# Patient Record
Sex: Male | Born: 1960
Health system: Southern US, Community
[De-identification: ages and names within clinical notes are randomized; demographics above are authoritative.]

## PROBLEM LIST (undated history)

## (undated) DIAGNOSIS — E119 Type 2 diabetes mellitus without complications: Secondary | ICD-10-CM

## (undated) DIAGNOSIS — I10 Essential (primary) hypertension: Secondary | ICD-10-CM

---

## 2003-04-03 ENCOUNTER — Encounter: Admission: RE | Admit: 2003-04-03 | Discharge: 2003-04-03 | Payer: Self-pay | Admitting: Internal Medicine

## 2005-04-16 IMAGING — CR DG CHEST 2V
2 series · 2 of 2 positions shown · non-contrast
Comparison: none

CLINICAL DATA: Iritis, cough, back pain.  Evaluate for possible sarcoid. 
 LUMBAR SPINE: 
 Five views of the lumbar spine were obtained.  The lumbar vertebrae are in normal alignment with normal intervertebral disc spaces.  Minimal anterior spurring is noted on the anterior superior aspect of L5.  The SI joints appear normal.

[view not recorded (1 of 2)]
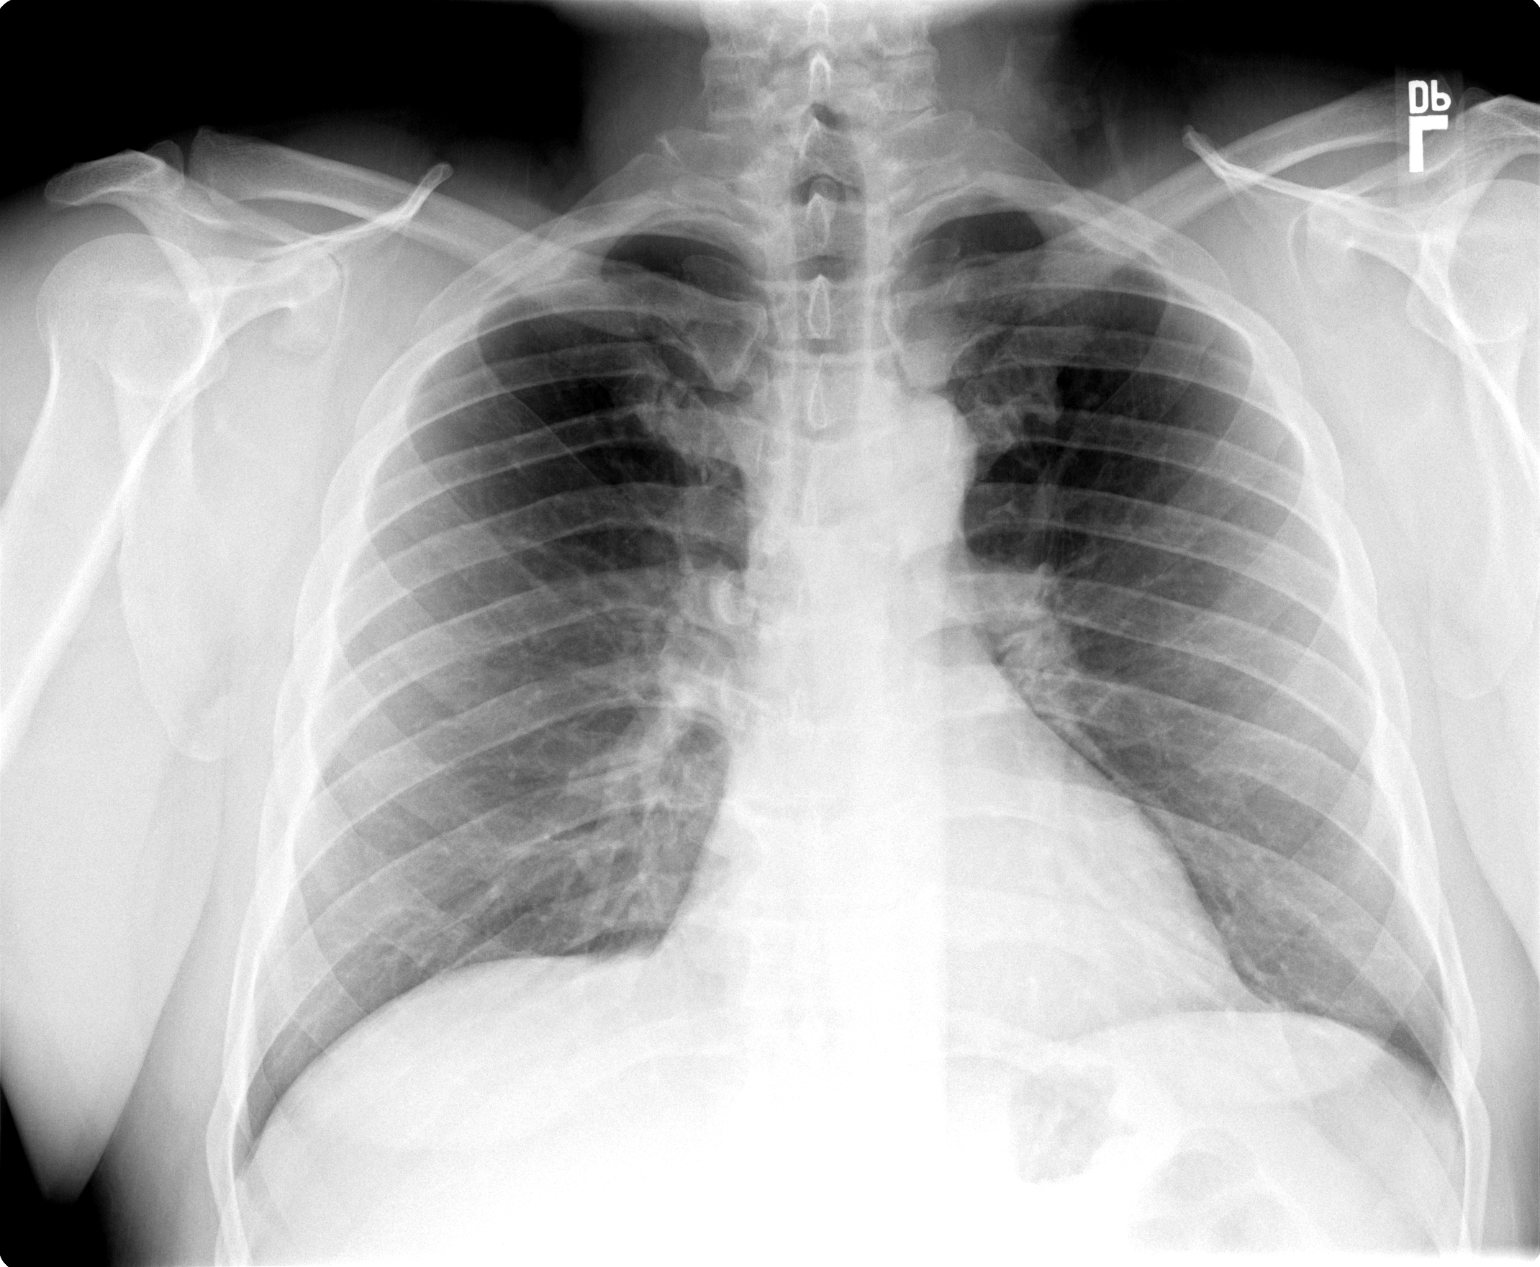

[view not recorded (2 of 2)]
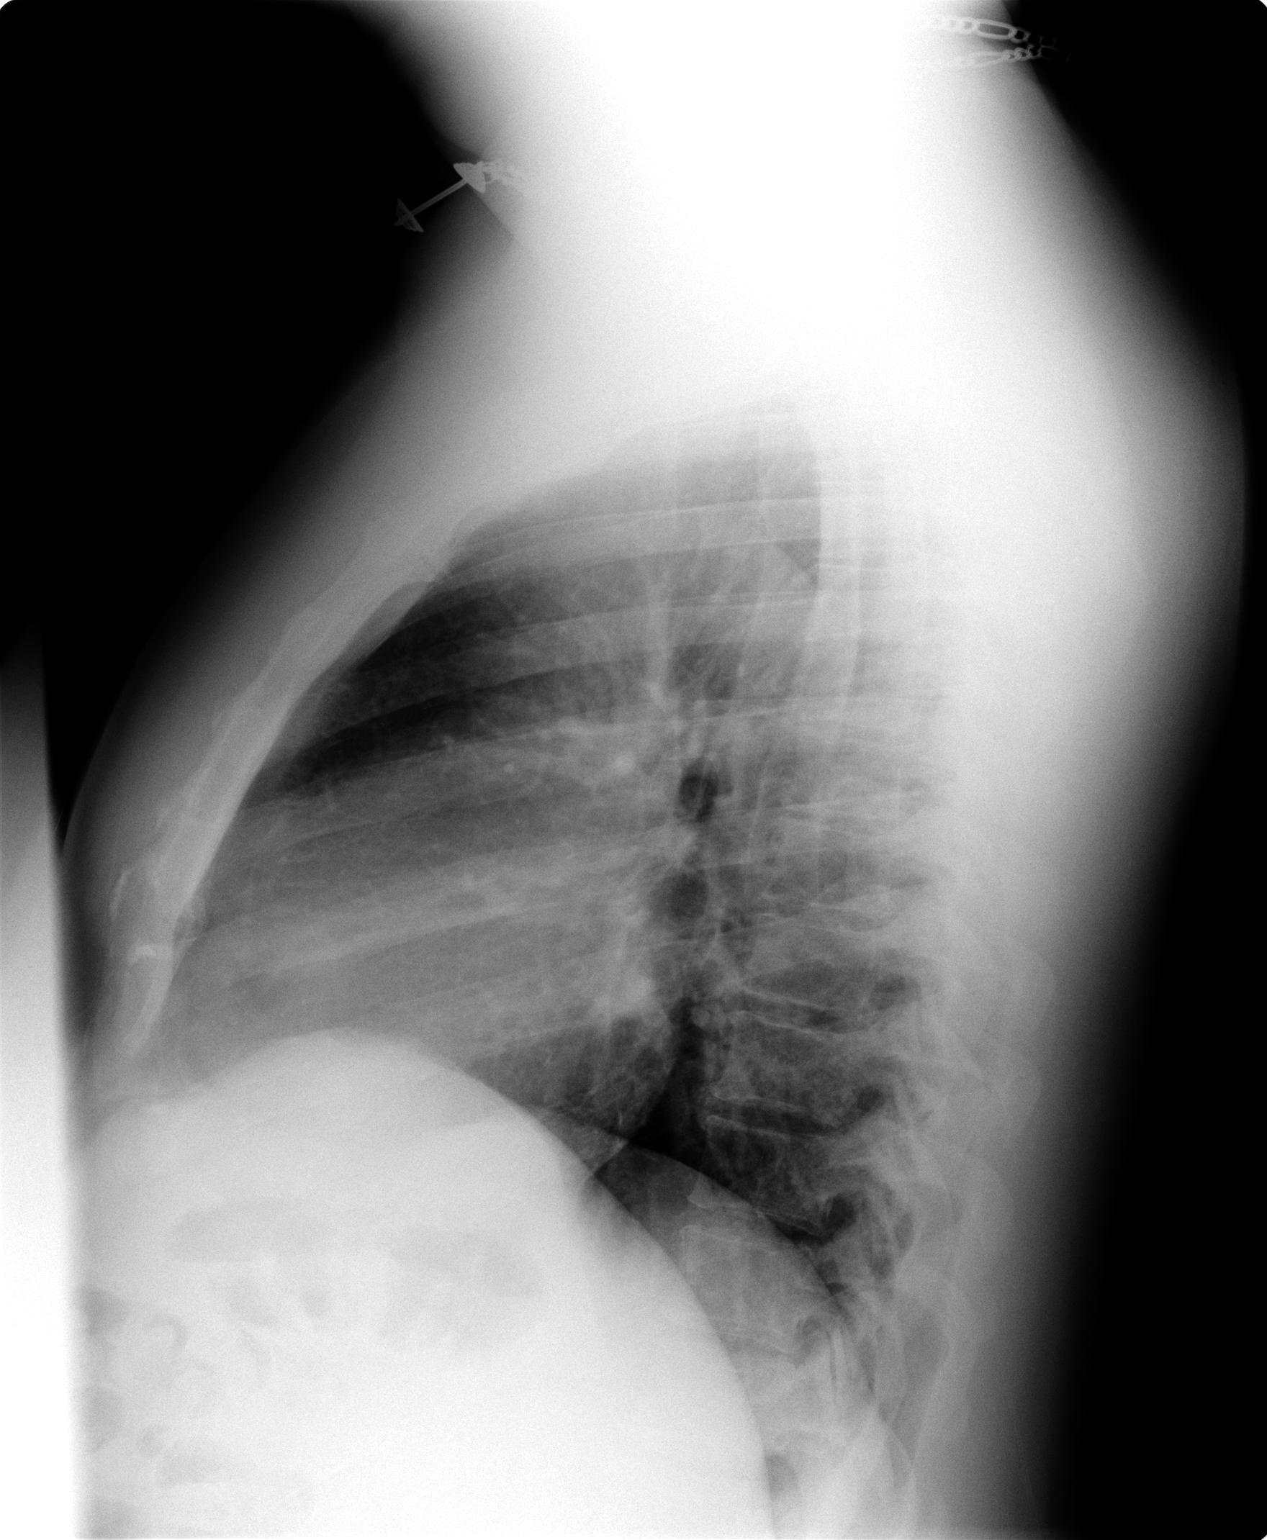

[2 of 2 positions shown; findings below may reference images not displayed]

IMPRESSION: Negative lumbar spine.  The SI joints appear normal. 
 CHEST X-RAY: 
 The heart size and mediastinal contours are unremarkable.  The lungs are clear.  The visualized skeleton is unremarkable.
IMPRESSION: No active lung disease.

## 2010-05-15 ENCOUNTER — Inpatient Hospital Stay (INDEPENDENT_AMBULATORY_CARE_PROVIDER_SITE_OTHER)
Admission: RE | Admit: 2010-05-15 | Discharge: 2010-05-15 | Disposition: A | Discharge status: Home / Self Care | Payer: Managed Care, Other (non HMO) | Source: Ambulatory Visit | Attending: Family Medicine | Admitting: Family Medicine

## 2010-05-15 DIAGNOSIS — J029 Acute pharyngitis, unspecified: Secondary | ICD-10-CM

## 2010-05-15 LAB — POCT RAPID STREP A (OFFICE): Streptococcus, Group A Screen (Direct): NEGATIVE

## 2012-07-30 ENCOUNTER — Emergency Department (HOSPITAL_COMMUNITY)
Admission: EM | Admit: 2012-07-30 | Discharge: 2012-07-30 | Disposition: A | Discharge status: Home / Self Care | Payer: BC Managed Care – PPO | Source: Home / Self Care | Attending: Emergency Medicine | Admitting: Emergency Medicine

## 2012-07-30 ENCOUNTER — Encounter (HOSPITAL_COMMUNITY): Payer: Self-pay | Admitting: Emergency Medicine

## 2012-07-30 DIAGNOSIS — J039 Acute tonsillitis, unspecified: Secondary | ICD-10-CM

## 2012-07-30 HISTORY — DX: Type 2 diabetes mellitus without complications: E11.9

## 2012-07-30 MED ORDER — IBUPROFEN 800 MG PO TABS
800.0000 mg | ORAL_TABLET | Freq: Once | ORAL | Status: AC
  Administered 2012-07-30: 800 mg via ORAL

## 2012-07-30 MED ORDER — IBUPROFEN 800 MG PO TABS
ORAL_TABLET | ORAL | Status: AC
  Filled 2012-07-30: qty 1

## 2012-07-30 MED ORDER — PENICILLIN V POTASSIUM 500 MG PO TABS: 500.0000 mg | ORAL_TABLET | Freq: Three times a day (TID) | ORAL | Status: DC

## 2012-07-30 MED ORDER — HYDROCODONE-ACETAMINOPHEN 5-325 MG PO TABS: ORAL_TABLET | ORAL | Status: DC

## 2012-07-30 NOTE — ED Notes (Signed)
Triage delay secondary to department needs

## 2012-07-30 NOTE — ED Provider Notes (Signed)
Chief Complaint:   Chief Complaint  Patient presents with  . Sore Throat  . Fever    History of Present Illness:   David Humphrey is a 52 year old male who has had a three-day history of sore throat, pain on swallowing, neck pain and postnasal drip. He has had a temperature of up to 102, headache, rhinorrhea, and diarrhea. He is able to breathe and swallow without any difficulty. He denies any nasal congestion, coughing, nausea, vomiting, or abdominal pain. He's not been exposed to strep or to mono and has had no prior history of throat problems.  Review of Systems:  Other than as noted above, the patient denies any of the following symptoms. Systemic:  No fever, chills, sweats, fatigue, myalgias, headache, or anorexia. Eye:  No redness, pain or drainage. ENT:  No earache, ear congestion, nasal congestion, sneezing, rhinorrhea, sinus pressure, sinus pain, or post nasal drip. Lungs:  No cough, sputum production, wheezing, shortness of breath, or chest pain. GI:  No abdominal pain, nausea, vomiting, or diarrhea. Skin:  No rash or itching.  PMFSH:  Past medical history, family history, social history, meds, allergies, and nurse's notes were reviewed.  There is no known exposure to strep or mono.  No prior history of step or mono.  The patient denies use of tobacco. He is a diabetic and takes Janumet and occasional Ambien for sleep.  Physical Exam:   Vital signs:  BP 120/83  Pulse 100  Temp(Src) 99.3 F (37.4 C) (Oral)  Resp 18  SpO2 98% General:  Alert, in no distress. Eye:  No conjunctival injection or drainage. Lids were normal. ENT:  TMs and canals were normal, without erythema or inflammation.  Nasal mucosa was clear and uncongested, without drainage.  Mucous membranes were moist.  Exam of pharynx reveals tonsils to be enlarged and red without exudate.  There were no oral ulcerations or lesions. Neck:  Supple, no adenopathy, tenderness or mass. Lungs:  No respiratory distress.  Lungs  were clear to auscultation, without wheezes, rales or rhonchi.  Breath sounds were clear and equal bilaterally.  Heart:  Regular rhythm, without gallops, murmers or rubs. Skin:  Clear, warm, and dry, without rash or lesions.  Labs:   Results for orders placed during the hospital encounter of 05/15/10  POCT RAPID STREP A      Result Value Range   Streptococcus, Group A Screen (Direct) NEGATIVE  NEGATIVE   Assessment:  The encounter diagnosis was Tonsillitis.  No evidence of peritonsillar abscess.  Plan:   1.  The following meds were prescribed:   Discharge Medication List as of 07/30/2012  2:04 PM    START taking these medications   Details  HYDROcodone-acetaminophen (NORCO/VICODIN) 5-325 MG per tablet 1 to 2 tabs every 4 to 6 hours as needed for pain., Print    penicillin v potassium (VEETID) 500 MG tablet Take 1 tablet (500 mg total) by mouth 3 (three) times daily., Starting 07/30/2012, Until Discontinued, Normal       2.  The patient was instructed in symptomatic care including hot saline gargles, throat lozenges, infectious precautions, and need to trade out toothbrush. Handouts were given. 3.  The patient was told to return if becoming worse in any way, if no better in 3 or 4 days, and given some red flag symptoms such as difficulty swallowing or breathing that would indicate earlier return. 4.  Follow up here if necessary.    Reuben Likes, MD 07/30/12 [id number]

## 2012-07-30 NOTE — ED Notes (Signed)
Reports sore throat and fever since Sunday.  Denies cough.  Reports temperatures of 102.9-99.  Patient also reports sides of neck very sore to palpation

## 2012-08-01 LAB — CULTURE, GROUP A STREP

## 2012-08-06 NOTE — ED Notes (Signed)
Throat culture: Group A Strep (S. Pyogenes).  Pt. Adequately treated with Pen V K. David Humphrey 08/06/2012

## 2012-11-21 ENCOUNTER — Ambulatory Visit: Payer: Self-pay | Admitting: Podiatry

## 2013-02-26 ENCOUNTER — Ambulatory Visit: Payer: Self-pay | Admitting: Podiatry

## 2015-09-29 ENCOUNTER — Encounter (HOSPITAL_COMMUNITY): Payer: Self-pay | Admitting: Emergency Medicine

## 2015-09-29 ENCOUNTER — Ambulatory Visit (HOSPITAL_COMMUNITY)
Admission: EM | Admit: 2015-09-29 | Discharge: 2015-09-29 | Disposition: A | Discharge status: Home / Self Care | Payer: 59 | Attending: Internal Medicine | Admitting: Internal Medicine

## 2015-09-29 DIAGNOSIS — L0291 Cutaneous abscess, unspecified: Secondary | ICD-10-CM

## 2015-09-29 MED ORDER — CEPHALEXIN 500 MG PO CAPS: 500.0000 mg | ORAL_CAPSULE | Freq: Four times a day (QID) | ORAL | 0 refills | Status: DC

## 2015-09-29 NOTE — ED Triage Notes (Signed)
[facility] a [facility] of a possible abscess on his buttock that he discovered yesterday.

## 2015-09-29 NOTE — ED Provider Notes (Signed)
CSN: [id number]     Arrival date & time 09/29/15  1001 History   First MD Initiated Contact with Patient 09/29/15 1101     Chief Complaint  Patient presents with  . Abscess   (Consider location/radiation/quality/duration/timing/severity/associated sxs/prior Treatment) HPI WAS CLEANING UP AFTER BM 2 DAYS AGO, NOTICED A VERY SMALL TENDER LUMP ON THE OUTSIDE OF HIS ANUS. TODAY LUMP IS A LITTLE LARGER AND MORE PAINFUL. NO HOME TREATMEN. NO HX OF HEMORRHOIDS.  Past Medical History:  Diagnosis Date  . Diabetes mellitus without complication (HCC)    History reviewed. No pertinent surgical history. History reviewed. No pertinent family history. Social History  Substance Use Topics  . Smoking status: Never Smoker  . Smokeless tobacco: Not on file  . Alcohol use Yes    Review of Systems  Denies: HEADACHE, NAUSEA, ABDOMINAL PAIN, CHEST PAIN, CONGESTION, DYSURIA, SHORTNESS OF BREATH  Allergies  Review of patient's allergies indicates no known allergies.  Home Medications   Prior to Admission medications   Medication Sig Start Date End Date Taking? Authorizing Provider  lisinopril (PRINIVIL,ZESTRIL) 10 MG tablet Take 10 mg by mouth daily.   Yes Historical Provider, MD  SitaGLIPtin-MetFORMIN HCl (JANUMET PO) Take by mouth.   Yes Historical Provider, MD  acetaminophen (TYLENOL) 325 MG tablet Take 650 mg by mouth every 6 (six) hours as needed for pain.    Historical Provider, MD  HYDROcodone-acetaminophen (NORCO/VICODIN) 5-325 MG per tablet 1 to 2 tabs every 4 to 6 hours as needed for pain. 07/30/12   Reuben Likesavid C Keller, MD  penicillin v potassium (VEETID) 500 MG tablet Take 1 tablet (500 mg total) by mouth 3 (three) times daily. 07/30/12   Reuben Likesavid C Keller, MD   Meds Ordered and Administered this Visit  Medications - No data to display  BP (!) 149/101 (BP Location: Left Arm)   Pulse 72   Temp 98 F (36.7 C) (Oral)   Resp 18   SpO2 100%  No data found.   Physical Exam NURSES NOTES AND VITAL  SIGNS REVIEWED. CONSTITUTIONAL: Well developed, well nourished, no acute distress HEENT: normocephalic, atraumatic EYES: Conjunctiva normal NECK:normal ROM, supple, no adenopathy PULMONARY:No respiratory distress, normal effort ABDOMINAL: Soft, ND, NT BS+, No CVAT MUSCULOSKELETAL: Normal ROM of all extremities, Rectum: small pea sized lesion, tender to palpation, firm not fluctuant.   SKIN: warm and dry without rash PSYCHIATRIC: Mood and affect, behavior are normal  Urgent Care Course   Clinical Course    Procedures (including critical care time)  Labs Review Labs Reviewed - No data to display  Imaging Review No results found.   Visual Acuity Review  Right Eye Distance:   Left Eye Distance:   Bilateral Distance:    Right Eye Near:   Left Eye Near:    Bilateral Near:        [facility]U [facility]CP AS ARRANGED FOR Thursday.  MDM   1. Abscess     Patient is reassured that there are no issues that require transfer to higher level of care at this time or additional tests. Patient is advised to continue home symptomatic treatment. Patient is advised that if there are new or worsening symptoms to attend the emergency department, contact primary care provider, or return to UC. Instructions of care provided discharged home in stable condition.    THIS NOTE WAS GENERATED USING A VOICE RECOGNITION SOFTWARE PROGRAM. ALL REASONABLE EFFORTS  WERE MADE TO PROOFREAD THIS DOCUMENT FOR ACCURACY.  I have verbally reviewed  the discharge instructions with the patient. A printed AVS was given to the patient.  All questions were answered prior to discharge.      Tharon Aquas, [place] 09/30/15 [id number]

## 2016-02-18 DIAGNOSIS — J069 Acute upper respiratory infection, unspecified: Secondary | ICD-10-CM | POA: Diagnosis not present

## 2016-04-17 DIAGNOSIS — E1165 Type 2 diabetes mellitus with hyperglycemia: Secondary | ICD-10-CM | POA: Diagnosis not present

## 2016-04-17 DIAGNOSIS — I10 Essential (primary) hypertension: Secondary | ICD-10-CM | POA: Diagnosis not present

## 2016-04-17 DIAGNOSIS — Z79899 Other long term (current) drug therapy: Secondary | ICD-10-CM | POA: Diagnosis not present

## 2016-09-15 DIAGNOSIS — I10 Essential (primary) hypertension: Secondary | ICD-10-CM | POA: Diagnosis not present

## 2016-09-15 DIAGNOSIS — E1165 Type 2 diabetes mellitus with hyperglycemia: Secondary | ICD-10-CM | POA: Diagnosis not present

## 2016-12-11 DIAGNOSIS — I10 Essential (primary) hypertension: Secondary | ICD-10-CM | POA: Diagnosis not present

## 2016-12-11 DIAGNOSIS — E1165 Type 2 diabetes mellitus with hyperglycemia: Secondary | ICD-10-CM | POA: Diagnosis not present

## 2016-12-11 DIAGNOSIS — E559 Vitamin D deficiency, unspecified: Secondary | ICD-10-CM | POA: Diagnosis not present

## 2017-02-06 DIAGNOSIS — E291 Testicular hypofunction: Secondary | ICD-10-CM | POA: Diagnosis not present

## 2017-02-20 DIAGNOSIS — E291 Testicular hypofunction: Secondary | ICD-10-CM | POA: Diagnosis not present

## 2017-02-20 DIAGNOSIS — Z72820 Sleep deprivation: Secondary | ICD-10-CM | POA: Diagnosis not present

## 2017-03-06 ENCOUNTER — Encounter (HOSPITAL_COMMUNITY): Payer: Self-pay | Admitting: Family Medicine

## 2017-03-06 ENCOUNTER — Ambulatory Visit (HOSPITAL_COMMUNITY)
Admission: EM | Admit: 2017-03-06 | Discharge: 2017-03-06 | Disposition: A | Discharge status: Home / Self Care | Payer: 59 | Attending: Emergency Medicine | Admitting: Emergency Medicine

## 2017-03-06 DIAGNOSIS — L723 Sebaceous cyst: Secondary | ICD-10-CM

## 2017-03-06 DIAGNOSIS — L089 Local infection of the skin and subcutaneous tissue, unspecified: Secondary | ICD-10-CM | POA: Diagnosis not present

## 2017-03-06 HISTORY — DX: Essential (primary) hypertension: I10

## 2017-03-06 MED ORDER — DOXYCYCLINE HYCLATE 100 MG PO CAPS: 100.0000 mg | ORAL_CAPSULE | Freq: Two times a day (BID) | ORAL | 0 refills | Status: AC

## 2017-03-06 MED ORDER — CHLORHEXIDINE GLUCONATE 4 % EX LIQD: Freq: Every day | CUTANEOUS | 0 refills | Status: DC | PRN

## 2017-03-06 MED ORDER — IBUPROFEN 600 MG PO TABS: 600.0000 mg | ORAL_TABLET | Freq: Four times a day (QID) | ORAL | 0 refills | Status: DC | PRN

## 2017-03-06 NOTE — ED Provider Notes (Signed)
HPI  SUBJECTIVE:  David Humphrey is a 57 y.o. male who presents with a painful erythematous mass of gradually increasing size on his left back starting a week ago.  States that he has had a cyst in this area "for years".  No N/V, fevers, drainage  No bodyaches.  Does not recall insect bite, trauma to area. No contacts with similar lesions. -  H/o MRSA skin infections. No h/o HIV, artifical joints or heart valves.  He has a past medical history of diabetes, hypertension.  PMD: Dorothyann Peng, MD  Past Medical History:  Diagnosis Date  . Diabetes mellitus without complication (HCC)   . Hypertension     History reviewed. No pertinent surgical history.  History reviewed. No pertinent family history.  Social History   Tobacco Use  . Smoking status: Never Smoker  Substance Use Topics  . Alcohol use: Yes  . Drug use: No    No current facility-administered medications for this encounter.   Current Outpatient Medications:  .  chlorhexidine (HIBICLENS) 4 % external liquid, Apply topically daily as needed. Dilute 10-15 mL in water, Use daily when bathing for 1-2 weeks, Disp: 120 mL, Rfl: 0 .  doxycycline (VIBRAMYCIN) 100 MG capsule, Take 1 capsule (100 mg total) by mouth 2 (two) times daily for 7 days., Disp: 14 capsule, Rfl: 0 .  ibuprofen (ADVIL,MOTRIN) 600 MG tablet, Take 1 tablet (600 mg total) by mouth every 6 (six) hours as needed., Disp: 30 tablet, Rfl: 0 .  lisinopril (PRINIVIL,ZESTRIL) 10 MG tablet, Take 10 mg by mouth daily., Disp: , Rfl:  .  SitaGLIPtin-MetFORMIN HCl (JANUMET PO), Take by mouth., Disp: , Rfl:   No Known Allergies   ROS  As noted in HPI.   Physical Exam  BP (!) 147/86   Pulse 72   Temp 98.5 F (36.9 C) (Oral)   Resp 18   SpO2 100%   Constitutional: Well developed, well nourished, no acute distress Eyes:  EOMI, conjunctiva normal bilaterally HENT: Normocephalic, atraumatic,mucus membranes moist Respiratory: Normal inspiratory  effort Cardiovascular: Normal rate GI: nondistended skin: 6.5 x 4 cm area of tender erythema, induration.  No central fluctuance.  Marked area of induration with a marker for reference.  See picture.   Musculoskeletal: no deformities Neurologic: Alert & oriented x 3, no focal neuro deficits Psychiatric: Speech and behavior appropriate   ED Course   Medications - No data to display  No orders of the defined types were placed in this encounter.   No results found for this or any previous visit (from the past 24 hour(s)). No results found.  ED Clinical Impression  Infected sebaceous cyst   ED Assessment/Plan  Appears to be an inflamed, infected sebaceous cyst.  It does not appear to be an abscess which would be amenable to draining today.  Will send patient home on doxycycline, refer to [facility] for follow-up within the week.  In the meantime, ibuprofen 600 mg with 1 g of Tylenol 3-4 times a day as needed for pain, warm compresses.  May return here if unable to see [facility] and getting worse.  Meds ordered this encounter  Medications  . doxycycline (VIBRAMYCIN) 100 MG capsule    Sig: Take 1 capsule (100 mg total) by mouth 2 (two) times daily for 7 days.    Dispense:  14 capsule    Refill:  0  . chlorhexidine (HIBICLENS) 4 % external liquid    Sig: Apply topically daily as needed. Dilute 10-15  mL in water, Use daily when bathing for 1-2 weeks    Dispense:  120 mL    Refill:  0  . ibuprofen (ADVIL,MOTRIN) 600 MG tablet    Sig: Take 1 tablet (600 mg total) by mouth every 6 (six) hours as needed.    Dispense:  30 tablet    Refill:  0    *This clinic note was created using Scientist, clinical (histocompatibility and immunogenetics)Dragon dictation software. Therefore, there may be occasional mistakes despite careful proofreading.  ?    Domenick GongMortenson, Kamin Niblack, MD 03/06/17 1248

## 2017-03-06 NOTE — ED Triage Notes (Signed)
Pt here for abscess to mid back area that has been there for years and over the past week has become inflamed, tender and more swollen.

## 2017-03-06 NOTE — Discharge Instructions (Signed)
Finish the antibiotic doxycycline, follow up [facility] within the week.  In the meantime, ibuprofen 600 mg with 1 g of Tylenol 3-4 times a day as needed for pain, warm compresses.  May return here if you are unable to see [facility] and getting worse.

## 2017-03-20 DIAGNOSIS — L02219 Cutaneous abscess of trunk, unspecified: Secondary | ICD-10-CM | POA: Diagnosis not present

## 2017-03-30 DIAGNOSIS — Z0000 Encounter for general adult medical examination without abnormal findings: Secondary | ICD-10-CM | POA: Diagnosis not present

## 2017-03-30 DIAGNOSIS — Z1211 Encounter for screening for malignant neoplasm of colon: Secondary | ICD-10-CM | POA: Diagnosis not present

## 2017-03-30 DIAGNOSIS — E1165 Type 2 diabetes mellitus with hyperglycemia: Secondary | ICD-10-CM | POA: Diagnosis not present

## 2017-03-30 DIAGNOSIS — I10 Essential (primary) hypertension: Secondary | ICD-10-CM | POA: Diagnosis not present

## 2017-08-09 DIAGNOSIS — E1165 Type 2 diabetes mellitus with hyperglycemia: Secondary | ICD-10-CM | POA: Diagnosis not present

## 2017-08-09 DIAGNOSIS — I10 Essential (primary) hypertension: Secondary | ICD-10-CM | POA: Diagnosis not present

## 2017-08-09 LAB — HEMOGLOBIN A1C: Hemoglobin A1C: 10.4

## 2017-08-09 LAB — HEPATIC FUNCTION PANEL
ALK PHOS: 79 (ref 25–125)
ALT: 20 (ref 10–40)
AST: 15 (ref 14–40)

## 2017-08-09 LAB — BASIC METABOLIC PANEL
BUN: 8 (ref 4–21)
Creatinine: 1.1 (ref 0.6–1.3)
Glucose: 218
Potassium: 4.3 (ref 3.4–5.3)
SODIUM: 143 (ref 137–147)

## 2017-10-01 DIAGNOSIS — R1032 Left lower quadrant pain: Secondary | ICD-10-CM | POA: Diagnosis not present

## 2017-10-01 DIAGNOSIS — K5792 Diverticulitis of intestine, part unspecified, without perforation or abscess without bleeding: Secondary | ICD-10-CM | POA: Diagnosis not present

## 2017-10-13 ENCOUNTER — Encounter: Payer: Self-pay | Admitting: Nurse Practitioner

## 2017-10-13 DIAGNOSIS — E1165 Type 2 diabetes mellitus with hyperglycemia: Secondary | ICD-10-CM | POA: Insufficient documentation

## 2017-10-13 DIAGNOSIS — I10 Essential (primary) hypertension: Secondary | ICD-10-CM | POA: Insufficient documentation

## 2017-10-13 DIAGNOSIS — I119 Hypertensive heart disease without heart failure: Secondary | ICD-10-CM | POA: Insufficient documentation

## 2017-11-06 ENCOUNTER — Ambulatory Visit: Payer: Self-pay | Admitting: Internal Medicine

## 2017-12-18 ENCOUNTER — Ambulatory Visit: Payer: 59 | Admitting: Internal Medicine

## 2017-12-18 ENCOUNTER — Encounter: Payer: Self-pay | Admitting: Internal Medicine

## 2017-12-18 VITALS — BP 140/80 | HR 91 | Temp 98.5°F | Ht 69.0 in | Wt 232.6 lb

## 2017-12-18 DIAGNOSIS — E1165 Type 2 diabetes mellitus with hyperglycemia: Secondary | ICD-10-CM | POA: Diagnosis not present

## 2017-12-18 DIAGNOSIS — J069 Acute upper respiratory infection, unspecified: Secondary | ICD-10-CM

## 2017-12-18 DIAGNOSIS — I10 Essential (primary) hypertension: Secondary | ICD-10-CM | POA: Diagnosis not present

## 2017-12-18 MED ORDER — ZOLPIDEM TARTRATE 10 MG PO TABS: 10.0000 mg | ORAL_TABLET | Freq: Every evening | ORAL | 0 refills | Status: DC | PRN

## 2017-12-18 MED ORDER — BENZONATATE 100 MG PO CAPS
ORAL_CAPSULE | ORAL | 0 refills | Status: DC
Start: 2017-12-18 — End: 2018-04-09

## 2017-12-18 NOTE — Patient Instructions (Signed)
Upper Respiratory Infection, Adult Most upper respiratory infections (URIs) are caused by a virus. A URI affects the nose, throat, and upper air passages. The most common type of URI is often called "the common cold." Follow these instructions at home:  Take medicines only as told by your doctor.  Gargle warm saltwater or take cough drops to comfort your throat as told by your doctor.  Use a warm mist humidifier or inhale steam from a shower to increase air moisture. This may make it easier to breathe.  Drink enough fluid to keep your pee (urine) clear or pale yellow.  Eat soups and other clear broths.  Have a healthy diet.  Rest as needed.  Go back to work when your fever is gone or your doctor says it is okay. ? You may need to stay home longer to avoid giving your URI to others. ? You can also wear a face mask and wash your hands often to prevent spread of the virus.  Use your inhaler more if you have asthma.  Do not use any tobacco products, including cigarettes, chewing tobacco, or electronic cigarettes. If you need help quitting, ask your doctor. Contact a doctor if:  You are getting worse, not better.  Your symptoms are not helped by medicine.  You have chills.  You are getting more short of breath.  You have brown or red mucus.  You have yellow or brown discharge from your nose.  You have pain in your face, especially when you bend forward.  You have a fever.  You have puffy (swollen) neck glands.  You have pain while swallowing.  You have white areas in the back of your throat. Get help right away if:  You have very bad or constant: ? Headache. ? Ear pain. ? Pain in your forehead, behind your eyes, and over your cheekbones (sinus pain). ? Chest pain.  You have long-lasting (chronic) lung disease and any of the following: ? Wheezing. ? Long-lasting cough. ? Coughing up blood. ? A change in your usual mucus.  You have a stiff neck.  You have  changes in your: ? Vision. ? Hearing. ? Thinking. ? Mood. This information is not intended to replace advice given to you by your health care provider. Make sure you discuss any questions you have with your health care provider. Document Released: 06/28/2007 Document Revised: 09/12/2015 Document Reviewed: 04/16/2013 Elsevier Interactive Patient Education  2018 Elsevier Inc.  

## 2017-12-18 NOTE — Progress Notes (Signed)
Subjective:     Patient ID: David Humphrey , male    DOB: 07-19-1960 , 57 y.o.   MRN: [id number]   Chief Complaint  Patient presents with  . Diabetes    HPI Here for DM and his post prandial glucose average 130's.  Also has been having a cold for the past few days, has a non productive cough today. Denies having a fever.      Past Medical History:  Diagnosis Date  . Diabetes mellitus without complication (HCC)   . Hypertension      Family History  Problem Relation Age of Onset  . Hypertension Mother   . Kidney disease Mother   . Cancer Father   . Cancer Paternal Grandmother      Current Outpatient Medications:  .  ibuprofen (ADVIL,MOTRIN) 600 MG tablet, Take 1 tablet (600 mg total) by mouth every 6 (six) hours as needed., Disp: 30 tablet, Rfl: 0 .  linaGLIPtin-metFORMIN HCl (JENTADUETO) 2.05-998 MG TABS, Take by mouth., Disp: , Rfl:  .  lisinopril (PRINIVIL,ZESTRIL) 10 MG tablet, 10 mg., Disp: , Rfl:  .  sitaGLIPtin-metformin (JANUMET) 50-500 MG tablet, Take by mouth., Disp: , Rfl:  .  SitaGLIPtin-MetFORMIN HCl (JANUMET PO), Take by mouth., Disp: , Rfl:  .  Vitamin D, Ergocalciferol, (DRISDOL) 1.25 MG (50000 UT) CAPS capsule, Take by mouth., Disp: , Rfl:  .  zolpidem (AMBIEN) 10 MG tablet, Take 10 mg by mouth at bedtime as needed for sleep., Disp: , Rfl:    No Known Allergies   Review of Systems  Constitutional: Positive for diaphoresis. Negative for chills and fever.       Only x 3 and at night.   HENT: Positive for postnasal drip and rhinorrhea. Negative for ear discharge, ear pain, sore throat and tinnitus.   Respiratory: Negative for chest tightness and shortness of breath.   Cardiovascular: Negative for chest pain, palpitations and leg swelling.  Gastrointestinal: Negative for constipation and diarrhea.  Endocrine: Negative for polydipsia and polyphagia.  Musculoskeletal: Negative for gait problem.  Neurological: Negative for dizziness, speech difficulty,  weakness, numbness and headaches.     Today's Vitals   12/18/17 0907  BP: 140/80  Pulse: 91  Temp: 98.5 F (36.9 C)  TempSrc: Oral  SpO2: 97%  Weight: 232 lb 9.6 oz (105.5 kg)  Height: 5\' 9"  (1.753 m)   Body mass index is 34.35 kg/m.   Objective:  Physical Exam    Constitutional: She is oriented to person, place, and time. She appears well-developed and well-nourished. No distress.  HENT: neg Head: Normocephalic and atraumatic.  Right Ear: External ear normal.  Left Ear: External ear normal.  Nose: Nose normal.  Eyes: Conjunctivae are normal. Right eye exhibits no discharge. Left eye exhibits no discharge. No scleral icterus.  Neck: Neck supple. No thyromegaly present.  No carotid bruits bilaterally  Cardiovascular: Normal rate and regular rhythm.  No murmur heard. Pulmonary/Chest: Effort normal and breath sounds normal. No respiratory distress.  Musculoskeletal: Normal range of motion. She exhibits no edema.  Lymphadenopathy:    She has no cervical adenopathy.  Neurological: She is alert and oriented to person, place, and time.  Skin: Skin is warm and dry. Capillary refill takes less than 2 seconds. No rash noted. She is not diaphoretic.  Psychiatric: She has a normal mood and affect. Her behavior is normal. Judgment and thought content normal.  Nursing note reviewed.     Assessment And Plan:     1. Uncontrolled  type 2 diabetes mellitus with hyperglycemia (HCC)- Chronic. We will see his results if we need to change his med, we will call him HGBA1C ordered FU 3 months.  2. Essential hypertension- chronic. May continue same medications.  CBC and CMP ordered.   FU 3 months  3- URI- acute. Placed on Tessalon perless prn.  As noted in rx.   Phiona Ramnauth RODRIGUEZ-SOUTHWORTH, PA-C

## 2017-12-19 ENCOUNTER — Other Ambulatory Visit: Payer: Self-pay | Admitting: Internal Medicine

## 2017-12-19 DIAGNOSIS — E1165 Type 2 diabetes mellitus with hyperglycemia: Secondary | ICD-10-CM

## 2017-12-19 LAB — CMP14 + ANION GAP
A/G RATIO: 1.6 (ref 1.2–2.2)
ALBUMIN: 4.2 g/dL (ref 3.5–5.5)
ALT: 15 IU/L (ref 0–44)
AST: 14 IU/L (ref 0–40)
Alkaline Phosphatase: 96 IU/L (ref 39–117)
Anion Gap: 18 mmol/L (ref 10.0–18.0)
BILIRUBIN TOTAL: 0.2 mg/dL (ref 0.0–1.2)
BUN / CREAT RATIO: 5 — AB (ref 9–20)
BUN: 6 mg/dL (ref 6–24)
CALCIUM: 9.2 mg/dL (ref 8.7–10.2)
CO2: 21 mmol/L (ref 20–29)
Chloride: 102 mmol/L (ref 96–106)
Creatinine, Ser: 1.11 mg/dL (ref 0.76–1.27)
GFR, EST AFRICAN AMERICAN: 86 mL/min/{1.73_m2} (ref >59)
GFR, EST NON AFRICAN AMERICAN: 74 mL/min/{1.73_m2} (ref >59)
GLOBULIN, TOTAL: 2.6 g/dL (ref 1.5–4.5)
Glucose: 272 mg/dL — ABNORMAL HIGH (ref 65–99)
POTASSIUM: 3.9 mmol/L (ref 3.5–5.2)
Sodium: 141 mmol/L (ref 134–144)
Total Protein: 6.8 g/dL (ref 6.0–8.5)

## 2017-12-19 LAB — CBC
HEMOGLOBIN: 12.9 g/dL — AB (ref 13.0–17.7)
Hematocrit: 37.7 % (ref 37.5–51.0)
MCH: 29.5 pg (ref 26.6–33.0)
MCHC: 34.2 g/dL (ref 31.5–35.7)
MCV: 86 fL (ref 79–97)
Platelets: 264 10*3/uL (ref 150–450)
RBC: 4.38 x10E6/uL (ref 4.14–5.80)
RDW: 13 % (ref 12.3–15.4)
WBC: 8.5 10*3/uL (ref 3.4–10.8)

## 2017-12-19 LAB — HEMOGLOBIN A1C
Est. average glucose Bld gHb Est-mCnc: 180 mg/dL
HEMOGLOBIN A1C: 7.9 % — AB (ref 4.8–5.6)

## 2017-12-19 MED ORDER — SITAGLIP PHOS-METFORMIN HCL ER 100-1000 MG PO TB24: ORAL_TABLET | ORAL | 0 refills | Status: DC

## 2017-12-19 NOTE — Progress Notes (Signed)
Sent increased dose of Janumet

## 2018-01-04 ENCOUNTER — Other Ambulatory Visit: Payer: Self-pay | Admitting: Nurse Practitioner

## 2018-01-22 ENCOUNTER — Other Ambulatory Visit: Payer: Self-pay | Admitting: Internal Medicine

## 2018-01-24 NOTE — Telephone Encounter (Signed)
Zolpidem refill

## 2018-01-24 NOTE — Telephone Encounter (Signed)
He is not my pt, I gave him his refill for when I saw him. I normally dont prescribe this medication. Needs to see if his PCP want to continue.

## 2018-01-28 ENCOUNTER — Other Ambulatory Visit: Payer: Self-pay

## 2018-01-28 ENCOUNTER — Telehealth: Payer: Self-pay

## 2018-01-28 MED ORDER — ZOLPIDEM TARTRATE 10 MG PO TABS: 10.0000 mg | ORAL_TABLET | Freq: Every evening | ORAL | 2 refills | Status: DC | PRN

## 2018-01-28 NOTE — Telephone Encounter (Signed)
Returned pt call and advised him his refill on Remus Loffler has been sent over to [facility] per his request. Adolph Pollack

## 2018-02-19 DIAGNOSIS — E291 Testicular hypofunction: Secondary | ICD-10-CM | POA: Diagnosis not present

## 2018-02-19 DIAGNOSIS — R972 Elevated prostate specific antigen [PSA]: Secondary | ICD-10-CM | POA: Diagnosis not present

## 2018-02-19 DIAGNOSIS — R861 Abnormal level of hormones in specimens from male genital organs: Secondary | ICD-10-CM | POA: Diagnosis not present

## 2018-04-02 ENCOUNTER — Encounter: Payer: Self-pay | Admitting: Internal Medicine

## 2018-04-09 ENCOUNTER — Other Ambulatory Visit: Payer: Self-pay

## 2018-04-09 ENCOUNTER — Encounter: Payer: Self-pay | Admitting: Internal Medicine

## 2018-04-09 ENCOUNTER — Ambulatory Visit: Payer: 59 | Admitting: Internal Medicine

## 2018-04-09 VITALS — BP 134/82 | HR 70 | Temp 97.8°F | Ht 68.4 in | Wt 230.0 lb

## 2018-04-09 DIAGNOSIS — R808 Other proteinuria: Secondary | ICD-10-CM

## 2018-04-09 DIAGNOSIS — Z1211 Encounter for screening for malignant neoplasm of colon: Secondary | ICD-10-CM | POA: Diagnosis not present

## 2018-04-09 DIAGNOSIS — R3129 Other microscopic hematuria: Secondary | ICD-10-CM | POA: Diagnosis not present

## 2018-04-09 DIAGNOSIS — R809 Proteinuria, unspecified: Secondary | ICD-10-CM | POA: Diagnosis not present

## 2018-04-09 DIAGNOSIS — Z125 Encounter for screening for malignant neoplasm of prostate: Secondary | ICD-10-CM

## 2018-04-09 DIAGNOSIS — E1165 Type 2 diabetes mellitus with hyperglycemia: Secondary | ICD-10-CM

## 2018-04-09 DIAGNOSIS — Z6834 Body mass index (BMI) 34.0-34.9, adult: Secondary | ICD-10-CM

## 2018-04-09 DIAGNOSIS — E669 Obesity, unspecified: Secondary | ICD-10-CM

## 2018-04-09 DIAGNOSIS — I10 Essential (primary) hypertension: Secondary | ICD-10-CM

## 2018-04-09 DIAGNOSIS — Z0001 Encounter for general adult medical examination with abnormal findings: Secondary | ICD-10-CM | POA: Diagnosis not present

## 2018-04-09 LAB — POCT URINALYSIS DIPSTICK
Bilirubin, UA: NEGATIVE
Glucose, UA: POSITIVE — AB
Ketones, UA: NEGATIVE
Leukocytes, UA: NEGATIVE
Nitrite, UA: NEGATIVE
Protein, UA: POSITIVE — AB
Spec Grav, UA: >=1.03 — AB (ref 1.010–1.025)
Urobilinogen, UA: 1 E.U./dL
pH, UA: 5.5 (ref 5.0–8.0)

## 2018-04-09 LAB — POCT UA - MICROALBUMIN
Creatinine, POC: 300 mg/dL
Microalbumin Ur, POC: 150 mg/L

## 2018-04-09 MED ORDER — BLOOD GLUCOSE MONITOR KIT: PACK | 0 refills | Status: DC

## 2018-04-09 NOTE — Progress Notes (Signed)
Subjective:     Patient ID: David Humphrey , male    DOB: Apr 21, 1960 , 58 y.o.   MRN: [id number]   Chief Complaint  Patient presents with  . Annual Exam    HPI  Pt is here for annual physical. Has seen someone at a " Hormone clinic" due to impotency and was told his testosterone was very low and was given a couple of options for treamtment, but he has not gone forward with this. Has noticed when his glucose is controlled he has erections, but when they are high, he did not even get it in the mornings.  Eye xm- 2016 Dental xm- up to date Colonoscopy- Never had one.  Works out 4 h a week x 2 weeks.  Past Medical History:  Diagnosis Date  . Diabetes mellitus without complication (HCC)   . Hypertension      Family History  Problem Relation Age of Onset  . Hypertension Mother   . Kidney disease Mother   . Cancer Father   . Cancer Paternal Grandmother      Current Outpatient Medications:  .  ibuprofen (ADVIL,MOTRIN) 600 MG tablet, Take 1 tablet (600 mg total) by mouth every 6 (six) hours as needed., Disp: 30 tablet, Rfl: 0 .  linaGLIPtin-metFORMIN HCl (JENTADUETO) 2.05-998 MG TABS, Take by mouth., Disp: , Rfl:  .  lisinopril (PRINIVIL,ZESTRIL) 10 MG tablet, TAKE 1 TABLET EVERY DAY, Disp: 30 tablet, Rfl: 2 .  Vitamin D, Ergocalciferol, (DRISDOL) 1.25 MG (50000 UT) CAPS capsule, Take by mouth., Disp: , Rfl:  .  zolpidem (AMBIEN) 10 MG tablet, Take 1 tablet (10 mg total) by mouth at bedtime as needed for sleep., Disp: 30 tablet, Rfl: 2   No Known Allergies   Review of Systems  Constitutional: Positive for diaphoresis. Negative for chills and fever.       Has awaken at night sweating at times. Two weeks ago started going back to the Gym doing wt training and threat mill.   HENT: Negative.   Eyes: Positive for visual disturbance.       Vision seems decreased and a little worse   Respiratory: Negative for cough, chest tightness and shortness of breath.   Cardiovascular: Negative  for chest pain, palpitations and leg swelling.  Gastrointestinal: Negative.   Endocrine: Positive for polydipsia and polyuria. Negative for cold intolerance, heat intolerance and polyphagia.       Only with elevated sugars Nov and Decem 2019.   Genitourinary: Negative.  Negative for decreased urine volume, difficulty urinating, discharge, dysuria, frequency, penile pain, penile swelling, scrotal swelling, testicular pain and urgency.       Has impotency and low testosterone  Skin: Negative.   Allergic/Immunologic: Negative for environmental allergies.  Neurological: Positive for numbness. Negative for dizziness, tremors, weakness, light-headedness and headaches.       Soles feel tingling and cold, but has not palpated if they feel cold.   Hematological: Negative for adenopathy. Does not bruise/bleed easily.  Psychiatric/Behavioral: Positive for sleep disturbance. The patient is not nervous/anxious.        Denies depression.  Ambien helps him sleep through the night.      Today's Vitals   04/09/18 0935  BP: 134/82  Pulse: 70  Temp: 97.8 F (36.6 C)  TempSrc: Oral  SpO2: 98%  Weight: 230 lb (104.3 kg)  Height: 5' 8.4" (1.737 m)   Body mass index is 34.56 kg/m.   Objective:  Physical Exam  Weight is down 2 lbs  BP 134/82 (BP Location: Left Arm, Patient Position: Sitting, Cuff Size: Large)   Pulse 70   Temp 97.8 F (36.6 C) (Oral)   Ht 5' 8.4" (1.737 m)   Wt 230 lb (104.3 kg)   SpO2 98%   BMI 34.56 kg/m   General Appearance:    Alert, cooperative, no distress, appears stated age  Head:    Normocephalic, without obvious abnormality, atraumatic  Eyes:    PERRL, conjunctiva/corneas clear, EOM's intact, fundi    benign, both eyes       Ears:    Normal TM's and external ear canals, both ears  Nose:   Nares normal, septum midline, mucosa normal, no drainage   or sinus tenderness  Throat:   Lips, mucosa, and tongue normal; teeth and gums normal  Neck:   Supple, symmetrical,  trachea midline, no adenopathy;       thyroid:  No enlargement/tenderness/nodules; no carotid   bruit or JVD  Back:     Symmetric, no curvature, ROM normal, no CVA tenderness  Lungs:     Clear to auscultation bilaterally, respirations unlabored  Chest wall:    No tenderness or deformity  Heart:    Regular rate and rhythm, S1 and S2 normal, no murmur, rub   or gallop  Abdomen:     Soft, non-tender, bowel sounds active all four quadrants,    no masses, no organomegaly  Genitalia:    Normal male without lesion, discharge or tenderness  Rectal:    Normal tone, normal prostate, no masses or tenderness;   guaiac negative stool  Extremities:   Extremities normal, atraumatic, no cyanosis or edema  Pulses:   2+ and symmetric all extremities  Skin:   Skin color, texture, turgor normal, no rashes or lesions  Lymph nodes:   Cervical, supraclavicular, and axillary nodes normal  Neurologic:   CNII-XII intact. Normal strength, sensation and reflexes      throughout    EKG- normal  Assessment And Plan:     1. Microscopic hematuria- new, urine culture sent out.    2. Other proteinuria- new, may be from microscopic hematuria.    3. Microalbuminuria- new. We will continue to watch, but was explained this is normally due to poor DM control.    4. Encounter for general adult medical examination with abnormal findings- routine. Fu 1 month.   - TSH - T4, Free - T3, free  5.Essential hypertension- stable. Fu 3-5 months with his PCP  - CMP14 + Anion Gap - CBC no Diff - Lipid Profile  6. Type 2 diabetes mellitus with hyperglycemia, without long-term current use of insulin (HCC)- chronic. stable. Fu 3-5 months with his PCP - Hemoglobin A1c  7. Screen for colon cancer- routine. Colonoscopy ordered. Hemoccult today was neg.   8. Screening for prostate cancer- screen- PSA ordered.    9- Hypogonadisms- no action. May continue Fu with clinic that diagnosed him with.       Caprisha Bridgett  RODRIGUEZ-SOUTHWORTH, PA-C

## 2018-04-09 NOTE — Patient Instructions (Signed)
Calorie Counting for Weight Loss °Calories are units of energy. Your body needs a certain amount of calories from food to keep you going throughout the day. When you eat more calories than your body needs, your body stores the extra calories as fat. When you eat fewer calories than your body needs, your body burns fat to get the energy it needs. °Calorie counting means keeping track of how many calories you eat and drink each day. Calorie counting can be helpful if you need to lose weight. If you make sure to eat fewer calories than your body needs, you should lose weight. Ask your health care provider what a healthy weight is for you. °For calorie counting to work, you will need to eat the right number of calories in a day in order to lose a healthy amount of weight per week. A dietitian can help you determine how many calories you need in a day and will give you suggestions on how to reach your calorie goal. °· A healthy amount of weight to lose per week is usually 1-2 lb (0.5-0.9 kg). This usually means that your daily calorie intake should be reduced by 500-750 calories. °· Eating 1,200 - 1,500 calories per day can help most women lose weight. °· Eating 1,500 - 1,800 calories per day can help most men lose weight. °What is my plan? °My goal is to have ___1800_______ calories per day. °If I have this many calories per day, I should lose around ___1-2__ pounds per week. °What do I need to know about calorie counting? °In order to meet your daily calorie goal, you will need to: °· Find out how many calories are in each food you would like to eat. Try to do this before you eat. °· Decide how much of the food you plan to eat. °· Write down what you ate and how many calories it had. Doing this is called keeping a food log. °To successfully lose weight, it is important to balance calorie counting with a healthy lifestyle that includes regular activity. Aim for 150 minutes of moderate exercise (such as walking) or 75  minutes of vigorous exercise (such as running) each week. °Where do I find calorie information? ° °The number of calories in a food can be found on a Nutrition Facts label. If a food does not have a Nutrition Facts label, try to look up the calories online or ask your dietitian for help. °Remember that calories are listed per serving. If you choose to have more than one serving of a food, you will have to multiply the calories per serving by the amount of servings you plan to eat. For example, the label on a package of bread might say that a serving size is 1 slice and that there are 90 calories in a serving. If you eat 1 slice, you will have eaten 90 calories. If you eat 2 slices, you will have eaten 180 calories. °How do I keep a food log? °Immediately after each meal, record the following information in your food log: °· What you ate. Don't forget to include toppings, sauces, and other extras on the food. °· How much you ate. This can be measured in cups, ounces, or number of items. °· How many calories each food and drink had. °· The total number of calories in the meal. °Keep your food log near you, such as in a small notebook in your pocket, or use a mobile app or website. Some programs will calculate   calories for you and show you how many calories you have left for the day to meet your goal. °What are some calorie counting tips? ° °· Use your calories on foods and drinks that will fill you up and not leave you hungry: °? Some examples of foods that fill you up are nuts and nut butters, vegetables, lean proteins, and high-fiber foods like whole grains. High-fiber foods are foods with more than 5 g fiber per serving. °? Drinks such as sodas, specialty coffee drinks, alcohol, and juices have a lot of calories, yet do not fill you up. °· Eat nutritious foods and avoid empty calories. Empty calories are calories you get from foods or beverages that do not have many vitamins or protein, such as candy, sweets, and  soda. It is better to have a nutritious high-calorie food (such as an avocado) than a food with few nutrients (such as a bag of chips). °· Know how many calories are in the foods you eat most often. This will help you calculate calorie counts faster. °· Pay attention to calories in drinks. Low-calorie drinks include water and unsweetened drinks. °· Pay attention to nutrition labels for "low fat" or "fat free" foods. These foods sometimes have the same amount of calories or more calories than the full fat versions. They also often have added sugar, starch, or salt, to make up for flavor that was removed with the fat. °· Find a way of tracking calories that works for you. Get creative. Try different apps or programs if writing down calories does not work for you. °What are some portion control tips? °· Know how many calories are in a serving. This will help you know how many servings of a certain food you can have. °· Use a measuring cup to measure serving sizes. You could also try weighing out portions on a kitchen scale. With time, you will be able to estimate serving sizes for some foods. °· Take some time to put servings of different foods on your favorite plates, bowls, and cups so you know what a serving looks like. °· Try not to eat straight from a bag or box. Doing this can lead to overeating. Put the amount you would like to eat in a cup or on a plate to make sure you are eating the right portion. °· Use smaller plates, glasses, and bowls to prevent overeating. °· Try not to multitask (for example, watch TV or use your computer) while eating. If it is time to eat, sit down at a table and enjoy your food. This will help you to know when you are full. It will also help you to be aware of what you are eating and how much you are eating. °What are tips for following this plan? °Reading food labels °· Check the calorie count compared to the serving size. The serving size may be smaller than what you are used to  eating. °· Check the source of the calories. Make sure the food you are eating is high in vitamins and protein and low in saturated and trans fats. °Shopping °· Read nutrition labels while you shop. This will help you make healthy decisions before you decide to purchase your food. °· Make a grocery list and stick to it. °Cooking °· Try to cook your favorite foods in a healthier way. For example, try baking instead of frying. °· Use low-fat dairy products. °Meal planning °· Use more fruits and vegetables. Half of your plate should be fruits   and vegetables.  Include lean proteins like poultry and fish. How do I count calories when eating out?  Ask for smaller portion sizes.  Consider sharing an entree and sides instead of getting your own entree.  If you get your own entree, eat only half. Ask for a box at the beginning of your meal and put the rest of your entree in it so you are not tempted to eat it.  If calories are listed on the menu, choose the lower calorie options.  Choose dishes that include vegetables, fruits, whole grains, low-fat dairy products, and lean protein.  Choose items that are boiled, broiled, grilled, or steamed. Stay away from items that are buttered, battered, fried, or served with cream sauce. Items labeled "crispy" are usually fried, unless stated otherwise.  Choose water, low-fat milk, unsweetened iced tea, or other drinks without added sugar. If you want an alcoholic beverage, choose a lower calorie option such as a glass of wine or light beer.  Ask for dressings, sauces, and syrups on the side. These are usually high in calories, so you should limit the amount you eat.  If you want a salad, choose a garden salad and ask for grilled meats. Avoid extra toppings like bacon, cheese, or fried items. Ask for the dressing on the side, or ask for olive oil and vinegar or lemon to use as dressing.  Estimate how many servings of a food you are given. For example, a serving of  cooked rice is  cup or about the size of half a baseball. Knowing serving sizes will help you be aware of how much food you are eating at restaurants. The list below tells you how big or small some common portion sizes are based on everyday objects: ? 1 oz--4 stacked dice. ? 3 oz--1 deck of cards. ? 1 tsp--1 die. ? 1 Tbsp-- a ping-pong ball. ? 2 Tbsp--1 ping-pong ball. ?  cup-- baseball. ? 1 cup--1 baseball. Summary  Calorie counting means keeping track of how many calories you eat and drink each day. If you eat fewer calories than your body needs, you should lose weight.  A healthy amount of weight to lose per week is usually 1-2 lb (0.5-0.9 kg). This usually means reducing your daily calorie intake by 500-750 calories.  The number of calories in a food can be found on a Nutrition Facts label. If a food does not have a Nutrition Facts label, try to look up the calories online or ask your dietitian for help.  Use your calories on foods and drinks that will fill you up, and not on foods and drinks that will leave you hungry.  Use smaller plates, glasses, and bowls to prevent overeating. This information is not intended to replace advice given to you by your health care provider. Make sure you discuss any questions you have with your health care provider. Document Released: 01/09/2005 Document Revised: 09/28/2017 Document Reviewed: 12/10/2015 Elsevier Interactive Patient Education  2019 [employer].

## 2018-04-10 ENCOUNTER — Other Ambulatory Visit: Payer: Self-pay | Admitting: Internal Medicine

## 2018-04-10 DIAGNOSIS — E1165 Type 2 diabetes mellitus with hyperglycemia: Secondary | ICD-10-CM

## 2018-04-10 LAB — PSA: Prostate Specific Ag, Serum: 0.7 ng/mL (ref 0.0–4.0)

## 2018-04-10 LAB — CBC
HEMOGLOBIN: 13.5 g/dL (ref 13.0–17.7)
Hematocrit: 41.3 % (ref 37.5–51.0)
MCH: 29 pg (ref 26.6–33.0)
MCHC: 32.7 g/dL (ref 31.5–35.7)
MCV: 89 fL (ref 79–97)
Platelets: 218 10*3/uL (ref 150–450)
RBC: 4.65 x10E6/uL (ref 4.14–5.80)
RDW: 13.1 % (ref 11.6–15.4)
WBC: 4.4 10*3/uL (ref 3.4–10.8)

## 2018-04-10 LAB — CMP14 + ANION GAP
ALBUMIN: 4.3 g/dL (ref 3.8–4.9)
ALK PHOS: 81 IU/L (ref 39–117)
ALT: 19 IU/L (ref 0–44)
ANION GAP: 16 mmol/L (ref 10.0–18.0)
AST: 20 IU/L (ref 0–40)
Albumin/Globulin Ratio: 1.7 (ref 1.2–2.2)
BUN / CREAT RATIO: 12 (ref 9–20)
BUN: 10 mg/dL (ref 6–24)
Bilirubin Total: 0.3 mg/dL (ref 0.0–1.2)
CO2: 22 mmol/L (ref 20–29)
CREATININE: 0.83 mg/dL (ref 0.76–1.27)
Calcium: 9.4 mg/dL (ref 8.7–10.2)
Chloride: 104 mmol/L (ref 96–106)
GFR calc Af Amer: 115 mL/min/{1.73_m2} (ref >59)
GFR, EST NON AFRICAN AMERICAN: 99 mL/min/{1.73_m2} (ref >59)
Globulin, Total: 2.6 g/dL (ref 1.5–4.5)
Glucose: 151 mg/dL — ABNORMAL HIGH (ref 65–99)
Potassium: 4.2 mmol/L (ref 3.5–5.2)
Sodium: 142 mmol/L (ref 134–144)
Total Protein: 6.9 g/dL (ref 6.0–8.5)

## 2018-04-10 LAB — HEMOGLOBIN A1C
ESTIMATED AVERAGE GLUCOSE: 263 mg/dL
HEMOGLOBIN A1C: 10.8 % — AB (ref 4.8–5.6)

## 2018-04-10 LAB — LIPID PANEL
CHOLESTEROL TOTAL: 144 mg/dL (ref 100–199)
Chol/HDL Ratio: 3.4 ratio (ref 0.0–5.0)
HDL: 42 mg/dL (ref >39)
LDL CALC: 87 mg/dL (ref 0–99)
TRIGLYCERIDES: 74 mg/dL (ref 0–149)
VLDL CHOLESTEROL CAL: 15 mg/dL (ref 5–40)

## 2018-04-10 LAB — T3, FREE: T3, Free: 3.4 pg/mL (ref 2.0–4.4)

## 2018-04-10 LAB — URINE CULTURE

## 2018-04-10 LAB — TSH: TSH: 2.41 u[IU]/mL (ref 0.450–4.500)

## 2018-04-10 LAB — T4, FREE: Free T4: 1.42 ng/dL (ref 0.82–1.77)

## 2018-04-10 NOTE — Progress Notes (Signed)
Referral to Care management placed to get help from Pharm D with his DM. To expect their call.

## 2018-04-12 ENCOUNTER — Ambulatory Visit: Payer: Self-pay

## 2018-04-12 DIAGNOSIS — E1165 Type 2 diabetes mellitus with hyperglycemia: Secondary | ICD-10-CM

## 2018-04-12 DIAGNOSIS — I10 Essential (primary) hypertension: Secondary | ICD-10-CM

## 2018-04-12 NOTE — Chronic Care Management (AMB) (Signed)
  Care Management   Note  04/12/2018 Name: David Humphrey MRN: [id number] DOB: [birth year]   Chronic Care Management   Outreach Note  04/12/2018 Name: David Humphrey MRN: [id number] DOB: 1961/01/02  Referred by: Dorothyann Peng, MD Reason for referral : Care Coordination (INITIAL CCM OUTREACH FOR ENROLLMENT )  An unsuccessful telephone outreach was attempted today. The patient was referred to the case management team by Beatrix Fetters, PA-C for assistance with uncontrolled Diabetes Mellitus.    Follow Up Plan: The CM team will reach out to the patient again over the next 5-7  days.    Delsa Sale, RN,CCM Care Management Coordinator [facility] Care Management/[facility]  Direct Phone: [phone]

## 2018-04-18 ENCOUNTER — Ambulatory Visit: Payer: Self-pay

## 2018-04-18 DIAGNOSIS — I10 Essential (primary) hypertension: Secondary | ICD-10-CM

## 2018-04-18 DIAGNOSIS — E1165 Type 2 diabetes mellitus with hyperglycemia: Secondary | ICD-10-CM

## 2018-04-18 NOTE — Chronic Care Management (AMB) (Signed)
   Chronic Care Management   Outreach Note  04/18/2018 Name: David Humphrey MRN: [id number] DOB: Mar 06, 1960  Referred by: Beatrix Fetters PA-C Reason for referral : Care Coordination (#2 CCM TELEPHONE OUTREACH FOR ENROLLMENT)   Second unsuccessful telephone outreach was attempted today. The patient was referred to the case management team by Beatrix Fetters PA-C for assistance with disease management for Diabetes.    Follow Up Plan: The CM team will reach out to the patient again over the next 7-10 days.    Delsa Sale, RN,CCM Care Management Coordinator [facility] Care Management/[facility]  Direct Phone: [phone]

## 2018-04-24 ENCOUNTER — Other Ambulatory Visit: Payer: Self-pay | Admitting: Internal Medicine

## 2018-04-26 ENCOUNTER — Ambulatory Visit: Payer: Self-pay

## 2018-04-26 DIAGNOSIS — E1165 Type 2 diabetes mellitus with hyperglycemia: Secondary | ICD-10-CM

## 2018-04-26 DIAGNOSIS — I10 Essential (primary) hypertension: Secondary | ICD-10-CM

## 2018-04-26 NOTE — Chronic Care Management (AMB) (Signed)
  Chronic Care Management   Outreach Note  04/26/2018 Name: David Humphrey MRN: [id number] DOB: [birth year]  Referred by: Beatrix Fetters PA-C Reason for referral : Care Coordination (#3 CCM TELEPHONE OUTREACH ATTEMPT)   Third unsuccessful telephone outreach was attempted today. The patient was referred to the case management team for assistance with Diabetes disease management.   Follow Up Plan: No further follow up will be attempted at this time due not being able to reach the patient and no return call from patient after three attempts. Please re-refer this patient in the future if a needs arises and the patient is agreeable to engage with the CCM team.    Delsa Sale, RN,CCM Care Management Coordinator [facility] Care Management/[facility]  Direct Phone: [phone]

## 2018-05-10 ENCOUNTER — Other Ambulatory Visit: Payer: Self-pay | Admitting: Nurse Practitioner

## 2018-05-10 NOTE — Telephone Encounter (Signed)
Zolpidem refill

## 2018-05-14 NOTE — Progress Notes (Signed)
[facility] Note  05/16/2018     CHIEF COMPLAINT Patient presents for Retina Evaluation and Diabetic Eye Exam   HISTORY OF PRESENT ILLNESS: David Humphrey is a 58 y.o. male who presents to the clinic today for:   HPI    Retina Evaluation    In both eyes.  This started 2 months ago.  Associated Symptoms Photophobia.  Negative for Flashes, Pain, Trauma, Fever, Weight Loss, Scalp Tenderness, Redness, Floaters, Distortion, Jaw Claudication, Fatigue, Shoulder/Hip pain, Glare and Blind Spot.  Context:  distance vision, mid-range vision and near vision.  Treatments tried include eye drops.  Response to treatment was no improvement.  I, the attending physician,  performed the HPI with the patient and updated documentation appropriately.          Diabetic Eye Exam    Vision fluctuates with blood sugars.  Associated Symptoms Distortion.  Negative for Blind Spot, Glare, Shoulder/Hip pain, Fatigue, Jaw Claudication, Photophobia, Floaters, Redness, Scalp Tenderness, Weight Loss, Fever, Trauma, Pain and Flashes.  Diabetes characteristics include Type 2 and taking oral medications.  This started 19 years ago.  Blood sugar level fluctuates.  Last Blood Glucose 130.  Last A1C 9.7.  I, the attending physician,  performed the HPI with the patient and updated documentation appropriately.          Comments    Referral of [facility] for retina/diabetic eval. Patient states he is DM2 x 19 yrs, BS 130 (05/15/2018), A1C 9.7 x 2 mos ago, his BS's  have been  unstable since he was took off of Januvia (insurance stopped coving it), he has been experiencing light sensitivity ou and "not able to focus when reading" since medication change two months ago .  Pt denies flashes , floaters and ocular pain. Pt is using Clear Eyes Gtt's PRN.       Last edited by Bernarda Caffey, MD on 05/16/2018  9:27 AM. (History)    pt states his PCP sent him here for a DM exam, pt states his last  eye exam was 2 years ago, but he is unsure who he saw, pt states he is having trouble seeing up close, but he has gotten some OTC readers (+1.25), and they do seem to help, pt states his last A1c was 10, pt states he was on Janumet and could keep his A1c and BS under control, but states his ins stopped covering it, so he is now on Jentadueto and it is harder to keep things under control, he states he also drinks a lot of soda   Referring physician: Glendale Chard, MD [address] [place], [place] [address]  HISTORICAL INFORMATION:   Selected notes from the MEDICAL RECORD NUMBER Referred by Shelby Mattocks PA-C for DM exam LEE:  Ocular Hx- PMH-DM (A1c: 10.8, 03.17.20, takes Janumet), HTN    CURRENT MEDICATIONS: No current outpatient medications on file. (Ophthalmic Drugs)   No current facility-administered medications for this visit.  (Ophthalmic Drugs)   Current Outpatient Medications (Other)  Medication Sig  . blood glucose meter kit and supplies KIT Dispense based on patient and insurance preference. Use up to four times daily as directed. (FOR ICD-9 250.00, 250.01).  Marland Kitchen ibuprofen (ADVIL,MOTRIN) 600 MG tablet Take 1 tablet (600 mg total) by mouth every 6 (six) hours as needed.  Marland Kitchen linaGLIPtin-metFORMIN HCl (JENTADUETO) 2.05-998 MG TABS Take by mouth.  Marland Kitchen lisinopril (PRINIVIL,ZESTRIL) 10 MG tablet TAKE 1 TABLET BY MOUTH EVERY DAY  .  Vitamin D, Ergocalciferol, (DRISDOL) 1.25 MG (50000 UT) CAPS capsule Take by mouth.  . zolpidem (AMBIEN) 10 MG tablet Take 1 tablet (10 mg total) by mouth at bedtime as needed for sleep.   No current facility-administered medications for this visit.  (Other)      REVIEW OF SYSTEMS: ROS    Positive for: Endocrine, Eyes   Negative for: Constitutional, Gastrointestinal, Neurological, Skin, Genitourinary, Musculoskeletal, HENT, Cardiovascular, Respiratory, Psychiatric, Allergic/Imm, Heme/Lymph   Last edited by Zenovia Jordan, LPN on  7/78/2423  5:36 AM. (History)       ALLERGIES No Known Allergies  PAST MEDICAL HISTORY Past Medical History:  Diagnosis Date  . Diabetes mellitus without complication ([place])   . Hypertension    History reviewed. No pertinent surgical history.  FAMILY HISTORY Family History  Problem Relation Age of Onset  . Hypertension Mother   . Kidney disease Mother   . Cancer Father   . Cancer Paternal Grandmother     SOCIAL HISTORY Social History   Tobacco Use  . Smoking status: Never Smoker  . Smokeless tobacco: Never Used  Substance Use Topics  . Alcohol use: Yes    Comment: socially  . Drug use: No         OPHTHALMIC EXAM:  Base Eye Exam    Visual Acuity (Snellen - Linear)      Right Left   Dist [place] 20/25 -1 20/25 +1   Dist ph [place] 20/20 -2 20/20 -2       Tonometry (Tonopen, 8:24 AM)      Right Left   Pressure 12 12       Pupils      Dark Light Shape React APD   Right 4 3 Round Brisk None   Left 4 3 Round Brisk None       Visual Fields (Counting fingers)      Left Right    Full Full       Extraocular Movement      Right Left    Full, Ortho Full, Ortho       Neuro/Psych    Oriented x3:  Yes       Dilation    Both eyes:  1.0% Mydriacyl, 2.5% Phenylephrine @ 8:24 AM       Dilation #2    Both eyes:  1.0% Mydriacyl, 2.5% Phenylephrine @ 8:33 AM        Slit Lamp and Fundus Exam    Slit Lamp Exam      Right Left   Lids/Lashes mild Dermatochalasis - upper lid mild Dermatochalasis - upper lid   Conjunctiva/Sclera mild nasal Pinguecula, Melanosis mild nasal Pinguecula, Melanosis   Cornea Arcus Arcus   Anterior Chamber deep and clear, narrow temporal angle deep and clear, narrow temporal angle   Iris Round and moderately dilated to 5.41m, No NVI Round and moderately dilated to 5.573m No NVI   Lens 2+ Nuclear sclerosis, 1-2+ Cortical cataract 2+ Nuclear sclerosis, 1-2+ Cortical cataract   Vitreous Vitreous syneresis Vitreous syneresis       Fundus  Exam      Right Left   Disc pink, sharp rim, +cupping pink, sharp rim, +cupping   C/D Ratio 0.6 0.6   Macula Flat, Good foveal reflex, mild Retinal pigment epithelial mottling, No heme or edema Flat, Good foveal reflex, mild Retinal pigment epithelial mottling, No heme or edema   Vessels mild Vascular attenuation, Tortuous mild Vascular attenuation, Tortuous   Periphery Attached, no heme Attached, no heme  IMAGING AND PROCEDURES  Imaging and Procedures for @TODAY@  OCT, Retina - OU - Both Eyes       Right Eye Quality was good. Central Foveal Thickness: 259. Progression has no prior data. Findings include normal foveal contour, no SRF, no IRF.   Left Eye Quality was good. Central Foveal Thickness: 257. Progression has no prior data. Findings include normal foveal contour, no SRF, no IRF.   Notes *Images captured and stored on drive  Diagnosis / Impression:  NFP, no IRF/SRF OU No DME OU   Clinical management:  See below  Abbreviations: NFP - Normal foveal profile. CME - cystoid macular edema. PED - pigment epithelial detachment. IRF - intraretinal fluid. SRF - subretinal fluid. EZ - ellipsoid zone. ERM - epiretinal membrane. ORA - outer retinal atrophy. ORT - outer retinal tubulation. SRHM - subretinal hyper-reflective material                 ASSESSMENT/PLAN:    ICD-10-CM   1. Diabetes mellitus type 2 without retinopathy (HCC) E11.9   2. Retinal edema H35.81 OCT, Retina - OU - Both Eyes  3. Essential hypertension I10   4. Hypertensive retinopathy of both eyes H35.033   5. Combined forms of age-related cataract of both eyes H25.813     1,2. Diabetes mellitus, type 2 without retinopathy - The incidence, risk factors for progression, natural history and treatment options for diabetic retinopathy  were discussed with patient.   - The need for close monitoring of blood glucose, blood pressure, and serum lipids, avoiding cigarette or any type of tobacco,  and the need for long term follow up was also discussed with patient. - f/u in 1 year, sooner prn  3,4. Hypertensive retinopathy OU - discussed importance of tight BP control - monitor  5. Mixed form age related cataract  - The symptoms of cataract, surgical options, and treatments and risks were discussed with patient.  - discussed diagnosis and progression  - not yet visually significant  - monitor for now   Ophthalmic Meds Ordered this visit:  No orders of the defined types were placed in this encounter.      Return in about 1 year (around 05/16/2019) for DM exam.  There are no Patient Instructions on file for this visit.   Explained the diagnoses, plan, and follow up with the patient and they expressed understanding.  Patient expressed understanding of the importance of proper follow up care.   This document serves as a record of services personally performed by Brian G. Zamora, MD, PhD. It was created on their behalf by Amanda Brown, OA, an ophthalmic assistant. The creation of this record is the provider's dictation and/or activities during the visit.    Electronically signed by: Amanda Brown, OA  04.21.2020 9:27 AM    Brian G. Zamora, M.D., Ph.D. Diseases & Surgery of the Retina and Vitreous [facility]  I have reviewed the above documentation for accuracy and completeness, and I agree with the above. Brian G. Zamora, M.D., Ph.D. 05/16/18 9:28 AM    Abbreviations: M myopia (nearsighted); A astigmatism; H hyperopia (farsighted); P presbyopia; Mrx spectacle prescription;  CTL contact lenses; OD right eye; OS left eye; OU both eyes  XT exotropia; ET esotropia; PEK punctate epithelial keratitis; PEE punctate epithelial erosions; DES dry eye syndrome; MGD meibomian gland dysfunction; ATs artificial tears; PFAT's preservative free artificial tears; NSC nuclear sclerotic cataract; PSC posterior subcapsular cataract; ERM epi-retinal membrane; PVD  posterior vitreous detachment;   RD retinal detachment; DM diabetes mellitus; DR diabetic retinopathy; NPDR non-proliferative diabetic retinopathy; PDR proliferative diabetic retinopathy; CSME clinically significant macular edema; DME diabetic macular edema; dbh dot blot hemorrhages; CWS cotton wool spot; POAG primary open angle glaucoma; C/D cup-to-disc ratio; HVF humphrey visual field; GVF goldmann visual field; OCT optical coherence tomography; IOP intraocular pressure; BRVO Branch retinal vein occlusion; CRVO central retinal vein occlusion; CRAO central retinal artery occlusion; BRAO branch retinal artery occlusion; RT retinal tear; SB scleral buckle; PPV pars plana vitrectomy; VH Vitreous hemorrhage; PRP panretinal laser photocoagulation; IVK intravitreal kenalog; VMT vitreomacular traction; MH Macular hole;  NVD neovascularization of the disc; NVE neovascularization elsewhere; AREDS age related eye disease study; ARMD age related macular degeneration; POAG primary open angle glaucoma; EBMD epithelial/anterior basement membrane dystrophy; ACIOL anterior chamber intraocular lens; IOL intraocular lens; PCIOL posterior chamber intraocular lens; Phaco/IOL phacoemulsification with intraocular lens placement; [place] photorefractive keratectomy; LASIK laser assisted in situ keratomileusis; HTN hypertension; DM diabetes mellitus; COPD chronic obstructive pulmonary disease

## 2018-05-16 ENCOUNTER — Encounter (INDEPENDENT_AMBULATORY_CARE_PROVIDER_SITE_OTHER): Payer: Self-pay | Admitting: Ophthalmology

## 2018-05-16 ENCOUNTER — Other Ambulatory Visit: Payer: Self-pay

## 2018-05-16 ENCOUNTER — Ambulatory Visit (INDEPENDENT_AMBULATORY_CARE_PROVIDER_SITE_OTHER): Payer: 59 | Admitting: Ophthalmology

## 2018-05-16 DIAGNOSIS — I10 Essential (primary) hypertension: Secondary | ICD-10-CM

## 2018-05-16 DIAGNOSIS — H3581 Retinal edema: Secondary | ICD-10-CM | POA: Diagnosis not present

## 2018-05-16 DIAGNOSIS — H35033 Hypertensive retinopathy, bilateral: Secondary | ICD-10-CM

## 2018-05-16 DIAGNOSIS — H25813 Combined forms of age-related cataract, bilateral: Secondary | ICD-10-CM

## 2018-05-16 DIAGNOSIS — E119 Type 2 diabetes mellitus without complications: Secondary | ICD-10-CM | POA: Diagnosis not present

## 2018-05-21 ENCOUNTER — Other Ambulatory Visit: Payer: Self-pay

## 2018-05-21 ENCOUNTER — Ambulatory Visit (INDEPENDENT_AMBULATORY_CARE_PROVIDER_SITE_OTHER): Payer: 59 | Admitting: Nurse Practitioner

## 2018-05-21 DIAGNOSIS — G4700 Insomnia, unspecified: Secondary | ICD-10-CM

## 2018-05-21 MED ORDER — ZOLPIDEM TARTRATE 10 MG PO TABS: 10.0000 mg | ORAL_TABLET | Freq: Every evening | ORAL | 2 refills | Status: DC | PRN

## 2018-05-21 NOTE — Progress Notes (Addendum)
Virtual Visit via Video (Doxy.me) failed, telephone visit done   This visit type was conducted due to national recommendations for restrictions regarding the COVID-19 Pandemic (e.g. social distancing) in an effort to limit this patient's exposure and mitigate transmission in our community.  Patients identity confirmed using two different identifiers.  This format is felt to be most appropriate for this patient at this time.  All issues noted in this document were discussed and addressed.  No physical exam was performed (except for noted visual exam findings with Video Visits).  Patient consents to video/telephone visit  Date:  05/22/2018   ID:  David Humphrey, DOB 02/21/1960, MRN [id number]  Patient Location:  [place]  Provider location:   Office    Chief Complaint:  Insomnia  History of Present Illness:    David Humphrey is a 58 y.o. male who presents via video conferencing for a telehealth visit today.    The patient does not have symptoms concerning for COVID-19 infection (fever, chills, cough, or new shortness of breath).   3-4 times out of a week have to take 1/2 of ativan to go sleep. Waking up more than not being able to go back to sleep.  Melatonin has exacerbated.   Insomnia  Primary symptoms: sleep disturbance.  The current episode started more than one month. The problem occurs every several days. The symptoms are aggravated by caffeine and alcohol. How many beverages per day that contain caffeine: 0 - 1.  Types of beverages you drink: tea and coffee. PMH includes: associated symptoms present, hypertension.     Past Medical History:  Diagnosis Date  . Diabetes mellitus without complication ([place])   . Hypertension    History reviewed. No pertinent surgical history.   Current Meds  Medication Sig  . blood glucose meter kit and supplies KIT Dispense based on patient and insurance preference. Use up to four times daily as directed. (FOR ICD-9 250.00,  250.01).  Marland Kitchen ibuprofen (ADVIL,MOTRIN) 600 MG tablet Take 1 tablet (600 mg total) by mouth every 6 (six) hours as needed.  Marland Kitchen linaGLIPtin-metFORMIN HCl (JENTADUETO) 2.05-998 MG TABS Take by mouth.  Marland Kitchen lisinopril (PRINIVIL,ZESTRIL) 10 MG tablet TAKE 1 TABLET BY MOUTH EVERY DAY  . Vitamin D, Ergocalciferol, (DRISDOL) 1.25 MG (50000 UT) CAPS capsule Take by mouth.  . zolpidem (AMBIEN) 10 MG tablet Take 1 tablet (10 mg total) by mouth at bedtime as needed for sleep.  . [DISCONTINUED] zolpidem (AMBIEN) 10 MG tablet Take 1 tablet (10 mg total) by mouth at bedtime as needed for sleep.     Allergies:   Patient has no known allergies.   Social History   Tobacco Use  . Smoking status: Never Smoker  . Smokeless tobacco: Never Used  Substance Use Topics  . Alcohol use: Yes    Comment: socially  . Drug use: No     Family Hx: The patient's family history includes Cancer in his father and paternal grandmother; Hypertension in his mother; Kidney disease in his mother.  ROS:   Please see the history of present illness.    Review of Systems  Constitutional: Negative.   Respiratory: Negative.   Cardiovascular: Negative for chest pain and palpitations.  Psychiatric/Behavioral: Positive for sleep disturbance. The patient has insomnia.     All other systems reviewed and are negative.   Labs/Other Tests and Data Reviewed:    Recent Labs: 04/09/2018: ALT 19; BUN 10; Creatinine, Ser 0.83; Hemoglobin 13.5; Platelets 218; Potassium 4.2;  Sodium 142; TSH 2.410   Recent Lipid Panel Lab Results  Component Value Date/Time   CHOL 144 04/09/2018 10:50 AM   TRIG 74 04/09/2018 10:50 AM   HDL 42 04/09/2018 10:50 AM   CHOLHDL 3.4 04/09/2018 10:50 AM   LDLCALC 87 04/09/2018 10:50 AM    Wt Readings from Last 3 Encounters:  04/09/18 230 lb (104.3 kg)  12/18/17 232 lb 9.6 oz (105.5 kg)  08/09/17 229 lb 12.8 oz (104.2 kg)     Exam:    Vital Signs:  There were no vitals taken for this visit.    Physical  Exam  Constitutional: He is well-developed, well-nourished, and in no distress. No distress.  Psychiatric: Mood, memory, affect and judgment normal.  Had a brief time where I could visualize patient on video.    ASSESSMENT & PLAN:    1. Insomnia, unspecified type  Chronic, takes [place] about 3 days a week  Discussed with him considering trying a different prescription sleep aid in the near future, at this time did not want to change due to the current Pandemic and changing may add to stress  Reports melatonin had an adverse effect on his sleep - zolpidem (AMBIEN) 10 MG tablet; Take 1 tablet (10 mg total) by mouth at bedtime as needed for sleep.  Dispense: 30 tablet; Refill: 2    COVID-19 Education: The signs and symptoms of COVID-19 were discussed with the patient and how to seek care for testing (follow up with PCP or arrange E-visit).  The importance of social distancing was discussed today.  Patient Risk:   After full review of this patients clinical status, I feel that they are at least moderate risk at this time.  Time:   Today, I have spent 12 minutes/ seconds with the patient with telehealth technology discussing above diagnoses.     Medication Adjustments/Labs and Tests Ordered: Current medicines are reviewed at length with the patient today.  Concerns regarding medicines are outlined above.   Tests Ordered: No orders of the defined types were placed in this encounter.   Medication Changes: Meds ordered this encounter  Medications  . zolpidem (AMBIEN) 10 MG tablet    Sig: Take 1 tablet (10 mg total) by mouth at bedtime as needed for sleep.    Dispense:  30 tablet    Refill:  2    Disposition:  Follow up He has an appt in June for HM  Signed, Minette Brine, FNP

## 2018-05-22 ENCOUNTER — Encounter: Payer: Self-pay | Admitting: Nurse Practitioner

## 2018-07-06 ENCOUNTER — Other Ambulatory Visit: Payer: Self-pay | Admitting: Internal Medicine

## 2018-07-16 ENCOUNTER — Other Ambulatory Visit: Payer: Self-pay

## 2018-07-16 ENCOUNTER — Ambulatory Visit: Payer: 59 | Admitting: Internal Medicine

## 2018-07-16 ENCOUNTER — Encounter: Payer: Self-pay | Admitting: Internal Medicine

## 2018-07-16 VITALS — BP 142/80 | HR 79 | Temp 97.6°F | Ht 68.4 in | Wt 234.6 lb

## 2018-07-16 DIAGNOSIS — I10 Essential (primary) hypertension: Secondary | ICD-10-CM | POA: Diagnosis not present

## 2018-07-16 DIAGNOSIS — R202 Paresthesia of skin: Secondary | ICD-10-CM

## 2018-07-16 DIAGNOSIS — Z23 Encounter for immunization: Secondary | ICD-10-CM

## 2018-07-16 DIAGNOSIS — Z1211 Encounter for screening for malignant neoplasm of colon: Secondary | ICD-10-CM | POA: Diagnosis not present

## 2018-07-16 DIAGNOSIS — E1165 Type 2 diabetes mellitus with hyperglycemia: Secondary | ICD-10-CM | POA: Diagnosis not present

## 2018-07-16 DIAGNOSIS — Z79899 Other long term (current) drug therapy: Secondary | ICD-10-CM

## 2018-07-16 MED ORDER — ONETOUCH DELICA PLUS LANCET33G MISC: 1.0000 | Freq: Two times a day (BID) | 2 refills | Status: DC

## 2018-07-16 MED ORDER — TETANUS-DIPHTH-ACELL PERTUSSIS 5-2.5-18.5 LF-MCG/0.5 IM SUSP
0.5000 mL | Freq: Once | INTRAMUSCULAR | Status: AC
  Administered 2018-07-16: 0.5 mL via INTRAMUSCULAR

## 2018-07-16 MED ORDER — ONETOUCH VERIO VI STRP: ORAL_STRIP | 2 refills | Status: DC

## 2018-07-16 NOTE — Patient Instructions (Signed)
Diabetes Mellitus and Foot Care  Foot care is an important part of your health, especially when you have diabetes. Diabetes may cause you to have problems because of poor blood flow (circulation) to your feet and legs, which can cause your skin to:   Become thinner and drier.   Break more easily.   Heal more slowly.   Peel and crack.  You may also have nerve damage (neuropathy) in your legs and feet, causing decreased feeling in them. This means that you may not notice minor injuries to your feet that could lead to more serious problems. Noticing and addressing any potential problems early is the best way to prevent future foot problems.  How to care for your feet  Foot hygiene   Wash your feet daily with warm water and mild soap. Do not use hot water. Then, pat your feet and the areas between your toes until they are completely dry. Do not soak your feet as this can dry your skin.   Trim your toenails straight across. Do not dig under them or around the cuticle. File the edges of your nails with an emery board or nail file.   Apply a moisturizing lotion or petroleum jelly to the skin on your feet and to dry, brittle toenails. Use lotion that does not contain alcohol and is unscented. Do not apply lotion between your toes.  Shoes and socks   Wear clean socks or stockings every day. Make sure they are not too tight. Do not wear knee-high stockings since they may decrease blood flow to your legs.   Wear shoes that fit properly and have enough cushioning. Always look in your shoes before you put them on to be sure there are no objects inside.   To break in new shoes, wear them for just a few hours a day. This prevents injuries on your feet.  Wounds, scrapes, corns, and calluses   Check your feet daily for blisters, cuts, bruises, sores, and redness. If you cannot see the bottom of your feet, use a mirror or ask someone for help.   Do not cut corns or calluses or try to remove them with medicine.   If you  find a minor scrape, cut, or break in the skin on your feet, keep it and the skin around it clean and dry. You may clean these areas with mild soap and water. Do not clean the area with peroxide, alcohol, or iodine.   If you have a wound, scrape, corn, or callus on your foot, look at it several times a day to make sure it is healing and not infected. Check for:  ? Redness, swelling, or pain.  ? Fluid or blood.  ? Warmth.  ? Pus or a bad smell.  General instructions   Do not cross your legs. This may decrease blood flow to your feet.   Do not use heating pads or hot water bottles on your feet. They may burn your skin. If you have lost feeling in your feet or legs, you may not know this is happening until it is too late.   Protect your feet from hot and cold by wearing shoes, such as at the beach or on hot pavement.   Schedule a complete foot exam at least once a year (annually) or more often if you have foot problems. If you have foot problems, report any cuts, sores, or bruises to your health care provider immediately.  Contact a health care provider if:     You have a medical condition that increases your risk of infection and you have any cuts, sores, or bruises on your feet.   You have an injury that is not healing.   You have redness on your legs or feet.   You feel burning or tingling in your legs or feet.   You have pain or cramps in your legs and feet.   Your legs or feet are numb.   Your feet always feel cold.   You have pain around a toenail.  Get help right away if:   You have a wound, scrape, corn, or callus on your foot and:  ? You have pain, swelling, or redness that gets worse.  ? You have fluid or blood coming from the wound, scrape, corn, or callus.  ? Your wound, scrape, corn, or callus feels warm to the touch.  ? You have pus or a bad smell coming from the wound, scrape, corn, or callus.  ? You have a fever.  ? You have a red line going up your leg.  Summary   Check your feet every day  for cuts, sores, red spots, swelling, and blisters.   Moisturize feet and legs daily.   Wear shoes that fit properly and have enough cushioning.   If you have foot problems, report any cuts, sores, or bruises to your health care provider immediately.   Schedule a complete foot exam at least once a year (annually) or more often if you have foot problems.  This information is not intended to replace advice given to you by your health care provider. Make sure you discuss any questions you have with your health care provider.  Document Released: 01/07/2000 Document Revised: 02/21/2017 Document Reviewed: 02/11/2016  [employer] Interactive Patient Education  2019 [employer]

## 2018-07-17 LAB — HIV ANTIBODY (ROUTINE TESTING W REFLEX): HIV Screen 4th Generation wRfx: NONREACTIVE

## 2018-07-17 LAB — C-PEPTIDE: C-Peptide: 3.1 ng/mL (ref 1.1–4.4)

## 2018-07-17 LAB — BMP8+EGFR
BUN/Creatinine Ratio: 16 (ref 9–20)
BUN: 15 mg/dL (ref 6–24)
CO2: 22 mmol/L (ref 20–29)
Calcium: 10.3 mg/dL — ABNORMAL HIGH (ref 8.7–10.2)
Chloride: 97 mmol/L (ref 96–106)
Creatinine, Ser: 0.95 mg/dL (ref 0.76–1.27)
GFR calc Af Amer: 104 mL/min/{1.73_m2} (ref >59)
GFR calc non Af Amer: 90 mL/min/{1.73_m2} (ref >59)
Glucose: 326 mg/dL — ABNORMAL HIGH (ref 65–99)
Potassium: 4.5 mmol/L (ref 3.5–5.2)
Sodium: 133 mmol/L — ABNORMAL LOW (ref 134–144)

## 2018-07-17 LAB — HEMOGLOBIN A1C
Est. average glucose Bld gHb Est-mCnc: 206 mg/dL
Hgb A1c MFr Bld: 8.8 % — ABNORMAL HIGH (ref 4.8–5.6)

## 2018-07-17 LAB — VITAMIN B12: Vitamin B-12: 844 pg/mL (ref 232–1245)

## 2018-07-17 LAB — HEPATITIS C ANTIBODY: Hep C Virus Ab: <0.1 s/co ratio (ref 0.0–0.9)

## 2018-07-21 NOTE — Progress Notes (Signed)
Subjective:     Patient ID: David Humphrey , male    DOB: August 10, 1960 , 58 y.o.   MRN: [id number]   Chief Complaint  Patient presents with  . Diabetes  . Immunizations    Tdap  . Hypertension    HPI  Diabetes He presents for his follow-up diabetic visit. He has type 2 diabetes mellitus. There are no hypoglycemic associated symptoms. Pertinent negatives for diabetes include no blurred vision and no chest pain. There are no hypoglycemic complications. Risk factors for coronary artery disease include diabetes mellitus, dyslipidemia, hypertension, male sex, sedentary lifestyle and obesity. He is following a generally healthy diet. He participates in exercise intermittently. An ACE inhibitor/angiotensin II receptor blocker is being taken.  Hypertension This is a chronic problem. The current episode started more than 1 year ago. The problem has been gradually improving since onset. Pertinent negatives include no blurred vision or chest pain. The current treatment provides moderate improvement. Compliance problems include exercise.      Past Medical History:  Diagnosis Date  . Diabetes mellitus without complication ([place])   . Hypertension      Family History  Problem Relation Age of Onset  . Hypertension Mother   . Kidney disease Mother   . Cancer Father   . Cancer Paternal Grandmother      Current Outpatient Medications:  .  Blood Glucose Monitoring Suppl (ONETOUCH VERIO) w/Device KIT, by Does not apply route. Use as directed to check blood sugars 2 times per day dx: E11.22, Disp: , Rfl:  .  glucose blood (ONETOUCH VERIO) test strip, Use as instructed to check blood sugars 2 times per day dx: e11.22, Disp: 150 each, Rfl: 2 .  ibuprofen (ADVIL,MOTRIN) 600 MG tablet, Take 1 tablet (600 mg total) by mouth every 6 (six) hours as needed., Disp: 30 tablet, Rfl: 0 .  Lancets (ONETOUCH DELICA PLUS [id number]) MISC, Inject 1 each into the skin 2 (two) times a day. Dx: e11.22, Disp: 150 each,  Rfl: 2 .  linaGLIPtin-metFORMIN HCl (JENTADUETO) 2.05-998 MG TABS, Take by mouth., Disp: , Rfl:  .  lisinopril (ZESTRIL) 10 MG tablet, TAKE 1 TABLET BY MOUTH EVERY DAY, Disp: 30 tablet, Rfl: 1 .  Vitamin D, Ergocalciferol, (DRISDOL) 1.25 MG (50000 UT) CAPS capsule, Take by mouth., Disp: , Rfl:  .  zolpidem (AMBIEN) 10 MG tablet, Take 1 tablet (10 mg total) by mouth at bedtime as needed for sleep., Disp: 30 tablet, Rfl: 2   No Known Allergies   Review of Systems  Constitutional: Negative.   Eyes: Negative for blurred vision.  Respiratory: Negative.   Cardiovascular: Negative.  Negative for chest pain.  Gastrointestinal: Negative.   Neurological: Positive for numbness.       He c/o numbness/tingling in his LE. He is unable to state what triggers his sx. Appear to worsen at night.   Psychiatric/Behavioral: Negative.      Today's Vitals   07/16/18 0944  BP: (!) 142/80  Pulse: 79  Temp: 97.6 F (36.4 C)  TempSrc: Oral  Weight: 234 lb 9.6 oz (106.4 kg)  Height: 5' 8.4" (1.737 m)   Body mass index is 35.25 kg/m.   Objective:  Physical Exam Vitals signs and nursing note reviewed.  Constitutional:      Appearance: Normal appearance.  Cardiovascular:     Rate and Rhythm: Normal rate and regular rhythm.     Heart sounds: Normal heart sounds.  Pulmonary:     Effort: Pulmonary effort is normal.  Breath sounds: Normal breath sounds.  Skin:    General: Skin is warm.  Neurological:     General: No focal deficit present.     Mental Status: He is alert.  Psychiatric:        Mood and Affect: Mood normal.         Assessment And Plan:     1. Uncontrolled type 2 diabetes mellitus with hyperglycemia ([place])  I will check labs as listed below. He is encouraged to take meds as directed. He is also encouraged to remove refined carbs from his diet. He will f/u in 3 months with Doreene Burke, DNP as requested.   - Hepatitis C antibody - Hemoglobin A1c - BMP8+EGFR - HIV antibody (with  reflex) - C-peptide  2. Essential hypertension  Fair control. He will continue with current meds for now. He is encouraged to avoid adding salt to his foods. Importance of dietary and medication compliance was discussed with the patient.   3. Paresthesia of both lower extremities  Pt advised his sx are likely due to diabetic neuropathy. However, I will also check vit b12 levels today. He understands that he will need to return for B12 injections if his levels are low.   - Vitamin B12  4. Screen for colon cancer  I will refer him to GI for CRC screening.  5. Drug therapy   6. Need for vaccination  - Tdap (BOOSTRIX) injection 0.5 mL        Maximino Greenland, MD    THE PATIENT IS ENCOURAGED TO PRACTICE SOCIAL DISTANCING DUE TO THE COVID-19 PANDEMIC.

## 2018-07-29 ENCOUNTER — Other Ambulatory Visit: Payer: Self-pay | Admitting: Internal Medicine

## 2018-07-29 DIAGNOSIS — G4700 Insomnia, unspecified: Secondary | ICD-10-CM

## 2018-07-29 MED ORDER — ZOLPIDEM TARTRATE 10 MG PO TABS: 10.0000 mg | ORAL_TABLET | Freq: Every evening | ORAL | 2 refills | Status: DC | PRN

## 2018-08-07 ENCOUNTER — Encounter: Payer: Self-pay | Admitting: Nurse Practitioner

## 2018-08-07 ENCOUNTER — Telehealth: Payer: Self-pay

## 2018-08-07 ENCOUNTER — Encounter: Payer: 59 | Admitting: Nurse Practitioner

## 2018-08-07 ENCOUNTER — Other Ambulatory Visit: Payer: Self-pay

## 2018-08-07 NOTE — Telephone Encounter (Signed)
I went to notify pt that the provider would be right in and the pt had left I have also called patient twice so that we could do a virtual appointment but pt did not answer and I was unable to leave a v/m. YRL,RMA

## 2018-08-08 ENCOUNTER — Ambulatory Visit: Payer: 59 | Admitting: Nurse Practitioner

## 2018-08-14 ENCOUNTER — Other Ambulatory Visit: Payer: Self-pay | Admitting: Nurse Practitioner

## 2018-08-23 NOTE — Progress Notes (Signed)
Did not get seen, left before hand

## 2018-09-11 ENCOUNTER — Other Ambulatory Visit: Payer: Self-pay | Admitting: Internal Medicine

## 2019-01-29 ENCOUNTER — Other Ambulatory Visit: Payer: Self-pay | Admitting: Internal Medicine

## 2019-01-29 DIAGNOSIS — G4700 Insomnia, unspecified: Secondary | ICD-10-CM

## 2019-01-30 ENCOUNTER — Other Ambulatory Visit: Payer: Self-pay

## 2019-01-30 ENCOUNTER — Encounter: Payer: Self-pay | Admitting: Nurse Practitioner

## 2019-01-30 ENCOUNTER — Other Ambulatory Visit (HOSPITAL_COMMUNITY)
Admission: RE | Admit: 2019-01-30 | Discharge: 2019-01-30 | Disposition: A | Discharge status: Home / Self Care | Payer: 59 | Source: Ambulatory Visit | Attending: Nurse Practitioner | Admitting: Nurse Practitioner

## 2019-01-30 ENCOUNTER — Ambulatory Visit: Payer: 59 | Admitting: Nurse Practitioner

## 2019-01-30 VITALS — BP 134/88 | HR 75 | Temp 98.3°F | Ht 68.4 in | Wt 242.4 lb

## 2019-01-30 DIAGNOSIS — I10 Essential (primary) hypertension: Secondary | ICD-10-CM | POA: Diagnosis not present

## 2019-01-30 DIAGNOSIS — H1032 Unspecified acute conjunctivitis, left eye: Secondary | ICD-10-CM | POA: Diagnosis not present

## 2019-01-30 DIAGNOSIS — G4700 Insomnia, unspecified: Secondary | ICD-10-CM

## 2019-01-30 DIAGNOSIS — E1165 Type 2 diabetes mellitus with hyperglycemia: Secondary | ICD-10-CM

## 2019-01-30 DIAGNOSIS — Z113 Encounter for screening for infections with a predominantly sexual mode of transmission: Secondary | ICD-10-CM

## 2019-01-30 LAB — POCT UA - MICROALBUMIN
Albumin/Creatinine Ratio, Urine, POC: 300
Creatinine, POC: 300 mg/dL
Microalbumin Ur, POC: 80 mg/L

## 2019-01-30 MED ORDER — ZOLPIDEM TARTRATE 10 MG PO TABS: 10.0000 mg | ORAL_TABLET | Freq: Every evening | ORAL | 2 refills | Status: DC | PRN

## 2019-01-30 MED ORDER — BACITRACIN-POLYMYXIN B 500-10000 UNIT/GM OP OINT: TOPICAL_OINTMENT | Freq: Two times a day (BID) | OPHTHALMIC | 0 refills | Status: AC

## 2019-01-30 NOTE — Progress Notes (Signed)
This visit occurred during the SARS-CoV-2 public health emergency.  Safety protocols were in place, including screening questions prior to the visit, additional usage of staff PPE, and extensive cleaning of exam room while observing appropriate contact time as indicated for disinfecting solutions.  Subjective:     Patient ID: David Humphrey , male    DOB: 05-15-1960 , 59 y.o.   MRN: [id number]   Chief Complaint  Patient presents with  . Diabetes  . Insomnia    pt stated he needs a refill on ambien    HPI  Left eye irritation and matting in the morning.  Has been occurring for the last 2 weeks.  Improves over the course of the day but worsens by the end of the day.    Diabetes He presents for his follow-up diabetic visit. He has type 2 diabetes mellitus. There are no diabetic associated symptoms. His weight is stable. He is following a generally healthy diet. When asked about meal planning, he reported none. He has not had a previous visit with a dietitian. Exercise: 5 days a week. (Blood sugars are 120) An ACE inhibitor/angiotensin II receptor blocker is being taken.  Insomnia Primary symptoms: sleep disturbance.  The current episode started more than one year. The onset quality is gradual. Prior diagnostic workup includes:  No prior workup.     Past Medical History:  Diagnosis Date  . Diabetes mellitus without complication ([place])   . Hypertension      Family History  Problem Relation Age of Onset  . Hypertension Mother   . Kidney disease Mother   . Cancer Father   . Cancer Paternal Grandmother      Current Outpatient Medications:  .  Blood Glucose Monitoring Suppl (ONETOUCH VERIO) w/Device KIT, by Does not apply route. Use as directed to check blood sugars 2 times per day dx: E11.22, Disp: , Rfl:  .  glucose blood (ONETOUCH VERIO) test strip, Use as instructed to check blood sugars 2 times per day dx: e11.22, Disp: 150 each, Rfl: 2 .  ibuprofen (ADVIL,MOTRIN) 600 MG tablet,  Take 1 tablet (600 mg total) by mouth every 6 (six) hours as needed., Disp: 30 tablet, Rfl: 0 .  JENTADUETO XR 2.05-998 MG TB24, TABLET BY MOUTH EVERY DAY WITH A MEAL, Disp: 30 tablet, Rfl: 5 .  lisinopril (ZESTRIL) 10 MG tablet, TAKE 1 TABLET BY MOUTH EVERY DAY, Disp: 90 tablet, Rfl: 1 .  testosterone cypionate (DEPOTESTOTERONE CYPIONATE) 100 MG/ML injection, Inject into the muscle 2 (two) times a week. For IM use only, Disp: , Rfl:  .  Vitamin D, Ergocalciferol, (DRISDOL) 1.25 MG (50000 UT) CAPS capsule, Take by mouth., Disp: , Rfl:  .  zolpidem (AMBIEN) 10 MG tablet, Take 1 tablet (10 mg total) by mouth at bedtime as needed for sleep., Disp: 30 tablet, Rfl: 2 .  linaGLIPtin-metFORMIN HCl (JENTADUETO) 2.05-998 MG TABS, Take 1 tablet by mouth 2 (two) times a day. , Disp: , Rfl:    No Known Allergies   Review of Systems  Constitutional: Negative.   Respiratory: Negative.   Cardiovascular: Negative.   Genitourinary: Negative.   Skin: Negative.   Hematological: Negative.   Psychiatric/Behavioral: Positive for sleep disturbance. The patient has insomnia.      Today's Vitals   01/30/19 0907  BP: 134/88  Pulse: 75  Temp: 98.3 F (36.8 C)  TempSrc: Oral  Weight: 242 lb 6.4 oz (110 kg)  Height: 5' 8.4" (1.737 m)  PainSc: 0-No pain  Body mass index is 36.43 kg/m.   Objective:  Physical Exam Constitutional:      General: He is not in acute distress.    Appearance: Normal appearance. He is obese.  Cardiovascular:     Rate and Rhythm: Normal rate and regular rhythm.     Pulses: Normal pulses.     Heart sounds: Normal heart sounds. No murmur.  Pulmonary:     Effort: Pulmonary effort is normal. No respiratory distress.     Breath sounds: Normal breath sounds.  Skin:    Capillary Refill: Capillary refill takes less than 2 seconds.  Neurological:     General: No focal deficit present.     Mental Status: He is alert and oriented to person, place, and time.     Cranial Nerves: No  cranial nerve deficit.         Assessment And Plan:     1. Uncontrolled type 2 diabetes mellitus with hyperglycemia ([employer])  Requested records from [facility] for his eye exam  Chronic  Continue with current medications  Hopefully once his blood sugar is stable he can have his colonoscopy - Hemoglobin A1c - CMP14+EGFR  2. Essential hypertension . B/P is controlled.  . CMP ordered to check renal function.  . The importance of regular exercise and dietary modification was stressed to the patient.  - Lipid Profile  3. Insomnia, unspecified type  Chronic, takes 2-3 times per week - zolpidem (AMBIEN) 10 MG tablet; Take 1 tablet (10 mg total) by mouth at bedtime as needed for sleep.  Dispense: 30 tablet; Refill: 2  4. Screening examination for STD (sexually transmitted disease)  - Urine cytology ancillary only  5. Acute bacterial conjunctivitis of left eye  Mild redness to right eye  Matting to eye without congestion - bacitracin-polymyxin b (POLYSPORIN) ophthalmic ointment; Place into both eyes 2 (two) times daily for 10 days. Place a 1/2 inch ribbon of ointment into the lower eyelid.  Dispense: 3.5 g; Refill: 0   Minette Brine, FNP    THE PATIENT IS ENCOURAGED TO PRACTICE SOCIAL DISTANCING DUE TO THE COVID-19 PANDEMIC.

## 2019-01-31 LAB — LIPID PANEL
Chol/HDL Ratio: 3.9 ratio (ref 0.0–5.0)
Cholesterol, Total: 139 mg/dL (ref 100–199)
HDL: 36 mg/dL — ABNORMAL LOW (ref >39)
LDL Chol Calc (NIH): 90 mg/dL (ref 0–99)
Triglycerides: 64 mg/dL (ref 0–149)
VLDL Cholesterol Cal: 13 mg/dL (ref 5–40)

## 2019-01-31 LAB — CMP14+EGFR
ALT: 20 IU/L (ref 0–44)
AST: 18 IU/L (ref 0–40)
Albumin/Globulin Ratio: 1.5 (ref 1.2–2.2)
Albumin: 4.1 g/dL (ref 3.8–4.9)
Alkaline Phosphatase: 78 IU/L (ref 39–117)
BUN/Creatinine Ratio: 10 (ref 9–20)
BUN: 11 mg/dL (ref 6–24)
Bilirubin Total: 0.2 mg/dL (ref 0.0–1.2)
CO2: 24 mmol/L (ref 20–29)
Calcium: 9 mg/dL (ref 8.7–10.2)
Chloride: 106 mmol/L (ref 96–106)
Creatinine, Ser: 1.1 mg/dL (ref 0.76–1.27)
GFR calc Af Amer: 86 mL/min/{1.73_m2} (ref >59)
GFR calc non Af Amer: 75 mL/min/{1.73_m2} (ref >59)
Globulin, Total: 2.8 g/dL (ref 1.5–4.5)
Glucose: 124 mg/dL — ABNORMAL HIGH (ref 65–99)
Potassium: 4 mmol/L (ref 3.5–5.2)
Sodium: 142 mmol/L (ref 134–144)
Total Protein: 6.9 g/dL (ref 6.0–8.5)

## 2019-01-31 LAB — HEMOGLOBIN A1C
Est. average glucose Bld gHb Est-mCnc: 160 mg/dL
Hgb A1c MFr Bld: 7.2 % — ABNORMAL HIGH (ref 4.8–5.6)

## 2019-02-03 LAB — URINE CYTOLOGY ANCILLARY ONLY
Chlamydia: NEGATIVE
Comment: NEGATIVE
Comment: NEGATIVE
Comment: NORMAL
Neisseria Gonorrhea: NEGATIVE
Trichomonas: NEGATIVE

## 2019-02-11 ENCOUNTER — Telehealth: Payer: Self-pay

## 2019-02-11 NOTE — Telephone Encounter (Signed)
Unable to leave vm. Attempt to give lab results  

## 2019-02-12 ENCOUNTER — Telehealth: Payer: Self-pay

## 2019-02-12 NOTE — Telephone Encounter (Signed)
-----   Message from Arnette Felts, FNP sent at 02/07/2019  6:33 PM EST ----- Cholesterol levels are normal except for your good cholesterol is 36 goal is greater than 39, continue to exercise regularly and increase your fiber intake. Your HgbA1c is down to 7.2 from 8.8, going in the right direction. Your kidney and liver functions are normal. You are negative for any STDs

## 2019-02-12 NOTE — Telephone Encounter (Signed)
Unable to leave vm. Pt needs lab results

## 2019-02-17 ENCOUNTER — Other Ambulatory Visit: Payer: Self-pay

## 2019-02-17 DIAGNOSIS — E559 Vitamin D deficiency, unspecified: Secondary | ICD-10-CM

## 2019-02-18 ENCOUNTER — Other Ambulatory Visit: Payer: Self-pay

## 2019-02-18 ENCOUNTER — Other Ambulatory Visit: Payer: 59

## 2019-02-18 DIAGNOSIS — E559 Vitamin D deficiency, unspecified: Secondary | ICD-10-CM

## 2019-02-19 LAB — VITAMIN D 25 HYDROXY (VIT D DEFICIENCY, FRACTURES): Vit D, 25-Hydroxy: 24.2 ng/mL — ABNORMAL LOW (ref 30.0–100.0)

## 2019-02-19 MED ORDER — VITAMIN D (ERGOCALCIFEROL) 1.25 MG (50000 UNIT) PO CAPS: 50000.0000 [IU] | ORAL_CAPSULE | ORAL | 1 refills | Status: DC

## 2019-03-11 ENCOUNTER — Other Ambulatory Visit: Payer: Self-pay | Admitting: Nurse Practitioner

## 2019-04-03 ENCOUNTER — Other Ambulatory Visit: Payer: Self-pay | Admitting: Internal Medicine

## 2019-04-15 ENCOUNTER — Encounter: Payer: 59 | Admitting: Internal Medicine

## 2019-05-09 NOTE — Progress Notes (Shared)
?  [facility] Note ? ?05/16/2019 ? ?  ? ?CHIEF COMPLAINT ?Patient presents for No chief complaint on file. ? ? ?HISTORY OF PRESENT ILLNESS: ?David Humphrey is a 59 y.o. male who presents to the clinic today for:  ? ?pt states his PCP sent him here for a DM exam, pt states his last eye exam was 2 years ago, but he is unsure who he saw, pt states he is having trouble seeing up close, but he has gotten some OTC readers (+1.25), and they do seem to help, pt states his last A1c was 10, pt states he was on Janumet and could keep his A1c and BS under control, but states his ins stopped covering it, so he is now on Jentadueto and it is harder to keep things under control, he states he also drinks a lot of soda ? ? ?Referring physician: ?Glendale Chard, MD ?[address] ?[address] ?[place],  [place] [address] ? ?HISTORICAL INFORMATION:  ? ?Selected notes from the [facility] ?Referred by Shelby Mattocks PA-C for DM exam ?LEE:  ?Ocular Hx- ?PMH-DM (A1c: 10.8, 03.17.20, takes Janumet), HTN ?  ? ?CURRENT MEDICATIONS: ?No current outpatient medications on file. (Ophthalmic Drugs)  ? ?No current facility-administered medications for this visit. (Ophthalmic Drugs)  ? ?Current Outpatient Medications (Other)  ?Medication Sig  ?? Blood Glucose Monitoring Suppl (ONETOUCH VERIO) w/Device KIT by Does not apply route. Use as directed to check blood sugars 2 times per day dx: E11.22  ?? glucose blood (ONETOUCH VERIO) test strip Use as instructed to check blood sugars 2 times per day dx: e11.22  ?? ibuprofen (ADVIL,MOTRIN) 600 MG tablet Take 1 tablet (600 mg total) by mouth every 6 (six) hours as needed.  ?? JENTADUETO XR 2.05-998 MG TB24 TABLET BY MOUTH EVERY DAY WITH A MEAL  ?? linaGLIPtin-metFORMIN HCl (JENTADUETO) 2.05-998 MG TABS Take 1 tablet by mouth 2 (two) times a day.   ?? lisinopril (ZESTRIL) 10 MG tablet TAKE 1 TABLET BY MOUTH EVERY DAY  ? ? testosterone cypionate (DEPOTESTOTERONE CYPIONATE) 100 MG/ML injection Inject into the mus

## 2019-05-16 ENCOUNTER — Encounter (INDEPENDENT_AMBULATORY_CARE_PROVIDER_SITE_OTHER): Payer: 59 | Admitting: Ophthalmology

## 2019-06-04 ENCOUNTER — Other Ambulatory Visit: Payer: Self-pay | Admitting: Nurse Practitioner

## 2019-06-04 DIAGNOSIS — G4700 Insomnia, unspecified: Secondary | ICD-10-CM

## 2019-06-04 NOTE — Telephone Encounter (Signed)
Patient is requesting a refill of Zolpidem Tartrate 10mg   Last OV- 01/30/2019  No future appt has been scheduled.

## 2019-06-06 NOTE — Telephone Encounter (Signed)
He needs to schedule an appt before he has this refill.

## 2019-06-16 ENCOUNTER — Telehealth: Payer: Self-pay

## 2019-06-16 NOTE — Telephone Encounter (Signed)
Called patient to advise him that in order to receive his refill of Ambien he will need to schedule an appt to be seen. LVM requesting patient to call office back to discuss.

## 2019-06-18 ENCOUNTER — Other Ambulatory Visit: Payer: Self-pay | Admitting: Nurse Practitioner

## 2019-06-18 DIAGNOSIS — G4700 Insomnia, unspecified: Secondary | ICD-10-CM

## 2019-06-18 MED ORDER — ZOLPIDEM TARTRATE 10 MG PO TABS: 10.0000 mg | ORAL_TABLET | Freq: Every evening | ORAL | 0 refills | Status: DC | PRN

## 2019-06-18 NOTE — Telephone Encounter (Signed)
I sent 30 days only

## 2019-06-18 NOTE — Progress Notes (Unsigned)
  This visit occurred during the SARS-CoV-2 public health emergency.  Safety protocols were in place, including screening questions prior to the visit, additional usage of staff PPE, and extensive cleaning of exam room while observing appropriate contact time as indicated for disinfecting solutions.  Subjective:     Patient ID: David Humphrey , male    DOB: 03-Apr-1960 , 59 y.o.   MRN: [id number]   No chief complaint on file.   HPI  HPI   Past Medical History:  Diagnosis Date  . Diabetes mellitus without complication ([place])   . Hypertension      Family History  Problem Relation Age of Onset  . Hypertension Mother   . Kidney disease Mother   . Cancer Father   . Cancer Paternal Grandmother      Current Outpatient Medications:  .  Blood Glucose Monitoring Suppl (ONETOUCH VERIO) w/Device KIT, by Does not apply route. Use as directed to check blood sugars 2 times per day dx: E11.22, Disp: , Rfl:  .  glucose blood (ONETOUCH VERIO) test strip, Use as instructed to check blood sugars 2 times per day dx: e11.22, Disp: 150 each, Rfl: 2 .  ibuprofen (ADVIL,MOTRIN) 600 MG tablet, Take 1 tablet (600 mg total) by mouth every 6 (six) hours as needed., Disp: 30 tablet, Rfl: 0 .  JENTADUETO XR 2.05-998 MG TB24, TABLET BY MOUTH EVERY DAY WITH A MEAL, Disp: 90 tablet, Rfl: 0 .  linaGLIPtin-metFORMIN HCl (JENTADUETO) 2.05-998 MG TABS, Take 1 tablet by mouth 2 (two) times a day. , Disp: , Rfl:  .  lisinopril (ZESTRIL) 10 MG tablet, TAKE 1 TABLET BY MOUTH EVERY DAY, Disp: 30 tablet, Rfl: 5 .  testosterone cypionate (DEPOTESTOTERONE CYPIONATE) 100 MG/ML injection, Inject into the muscle 2 (two) times a week. For IM use only, Disp: , Rfl:  .  Vitamin D, Ergocalciferol, (DRISDOL) 1.25 MG (50000 UNIT) CAPS capsule, Take 1 capsule (50,000 Units total) by mouth every 7 (seven) days., Disp: 12 capsule, Rfl: 1 .  zolpidem (AMBIEN) 10 MG tablet, Take 1 tablet (10 mg total) by mouth at bedtime as needed for  sleep., Disp: 30 tablet, Rfl: 0   No Known Allergies   Review of Systems   There were no vitals filed for this visit. There is no height or weight on file to calculate BMI.   Objective:  Physical Exam      Assessment And Plan:     1. Insomnia, unspecified type *** - zolpidem (AMBIEN) 10 MG tablet; Take 1 tablet (10 mg total) by mouth at bedtime as needed for sleep.  Dispense: 30 tablet; Refill: 0      ***  Minette Brine, FNP    THE PATIENT IS ENCOURAGED TO PRACTICE SOCIAL DISTANCING DUE TO THE COVID-19 PANDEMIC.

## 2019-06-18 NOTE — Telephone Encounter (Signed)
Patient has an appt set for June 1, he wants to know if any Zolpidem cna be sent to his pharmacy to last him until his appt.

## 2019-06-24 ENCOUNTER — Ambulatory Visit: Payer: 59 | Admitting: Nurse Practitioner

## 2019-06-24 ENCOUNTER — Other Ambulatory Visit: Payer: Self-pay | Admitting: Nurse Practitioner

## 2019-06-24 ENCOUNTER — Encounter: Payer: Self-pay | Admitting: Nurse Practitioner

## 2019-06-24 ENCOUNTER — Other Ambulatory Visit: Payer: Self-pay

## 2019-06-24 VITALS — BP 132/78 | HR 86 | Temp 98.0°F | Ht 67.8 in | Wt 231.0 lb

## 2019-06-24 DIAGNOSIS — I10 Essential (primary) hypertension: Secondary | ICD-10-CM | POA: Diagnosis not present

## 2019-06-24 DIAGNOSIS — G4700 Insomnia, unspecified: Secondary | ICD-10-CM

## 2019-06-24 DIAGNOSIS — E1165 Type 2 diabetes mellitus with hyperglycemia: Secondary | ICD-10-CM | POA: Diagnosis not present

## 2019-06-24 DIAGNOSIS — N529 Male erectile dysfunction, unspecified: Secondary | ICD-10-CM | POA: Diagnosis not present

## 2019-06-24 MED ORDER — ZOLPIDEM TARTRATE 10 MG PO TABS: 10.0000 mg | ORAL_TABLET | Freq: Every evening | ORAL | 5 refills | Status: DC | PRN

## 2019-06-24 MED ORDER — ONETOUCH VERIO W/DEVICE KIT: 1.0000 | PACK | Freq: Every day | 0 refills | Status: DC

## 2019-06-24 MED ORDER — ONETOUCH VERIO VI STRP: ORAL_STRIP | 2 refills | Status: DC

## 2019-06-24 MED ORDER — JENTADUETO XR 2.5-1000 MG PO TB24: 1.0000 | ORAL_TABLET | Freq: Every day | ORAL | 0 refills | Status: DC

## 2019-06-24 NOTE — Progress Notes (Signed)
This visit occurred during the SARS-CoV-2 public health emergency.  Safety protocols were in place, including screening questions prior to the visit, additional usage of staff PPE, and extensive cleaning of exam room while observing appropriate contact time as indicated for disinfecting solutions.  Subjective:     Patient ID: David Humphrey , male    DOB: [birth year] , 59 y.o.   MRN: [id number]   Chief Complaint  Patient presents with  . Diabetes  . Insomnia    HPI  Reports if he does not get enough rest he feels bad during the day.  He is concerned about erectile dysfunction.  He reports when he took testosterone injections he had an improvement in his erectile dysfunction  Diabetes He presents for his follow-up diabetic visit. He has type 2 diabetes mellitus. There are no hypoglycemic associated symptoms. Pertinent negatives for hypoglycemia include no dizziness or headaches. There are no diabetic associated symptoms. Pertinent negatives for diabetes include no chest pain. There are no hypoglycemic complications. There are no diabetic complications. Current diabetic treatment includes oral agent (dual therapy). He is compliant with treatment most of the time. He is following a generally healthy diet. He has not had a previous visit with a dietitian. (He is not checking his blood sugars due to machine is broke) An ACE inhibitor/angiotensin II receptor blocker is being taken. Eye exam is not current.  Insomnia Primary symptoms: difficulty falling asleep.  The problem occurs every several days. The problem is unchanged. The symptoms are relieved by medication (Ambien, has not tried any other medications). Typical bedtime:  8-10 P.M. (830p-9p).  PMH includes: no hypertension.     Past Medical History:  Diagnosis Date  . Diabetes mellitus without complication ([place])   . Hypertension      Family History  Problem Relation Age of Onset  . Hypertension Mother   . Kidney disease Mother   .  Cancer Father   . Cancer Paternal Grandmother      Current Outpatient Medications:  .  Blood Glucose Monitoring Suppl (ONETOUCH VERIO) w/Device KIT, by Does not apply route. Use as directed to check blood sugars 2 times per day dx: E11.22, Disp: , Rfl:  .  glucose blood (ONETOUCH VERIO) test strip, Use as instructed to check blood sugars 2 times per day dx: e11.22, Disp: 150 each, Rfl: 2 .  ibuprofen (ADVIL,MOTRIN) 600 MG tablet, Take 1 tablet (600 mg total) by mouth every 6 (six) hours as needed., Disp: 30 tablet, Rfl: 0 .  JENTADUETO XR 2.05-998 MG TB24, TABLET BY MOUTH EVERY DAY WITH A MEAL, Disp: 90 tablet, Rfl: 0 .  linaGLIPtin-metFORMIN HCl (JENTADUETO) 2.05-998 MG TABS, Take 1 tablet by mouth 2 (two) times a day. , Disp: , Rfl:  .  lisinopril (ZESTRIL) 10 MG tablet, TAKE 1 TABLET BY MOUTH EVERY DAY, Disp: 30 tablet, Rfl: 5 .  testosterone cypionate (DEPOTESTOTERONE CYPIONATE) 100 MG/ML injection, Inject into the muscle 2 (two) times a week. For IM use only, Disp: , Rfl:  .  Vitamin D, Ergocalciferol, (DRISDOL) 1.25 MG (50000 UNIT) CAPS capsule, Take 1 capsule (50,000 Units total) by mouth every 7 (seven) days., Disp: 12 capsule, Rfl: 1 .  zolpidem (AMBIEN) 10 MG tablet, Take 1 tablet (10 mg total) by mouth at bedtime as needed for sleep., Disp: 30 tablet, Rfl: 0   No Known Allergies   Review of Systems  Constitutional: Negative.   Respiratory: Negative.   Cardiovascular: Negative.  Negative for chest pain, palpitations and  leg swelling.  Neurological: Negative for dizziness and headaches.  Psychiatric/Behavioral: The patient has insomnia.      Today's Vitals   06/24/19 1612  BP: 132/78  Pulse: 86  Temp: 98 F (36.7 C)  TempSrc: Oral  Weight: 231 lb (104.8 kg)  Height: 5' 7.8" (1.722 m)  PainSc: 0-No pain   Body mass index is 35.33 kg/m.   Objective:  Physical Exam Constitutional:      General: He is not in acute distress.    Appearance: Normal appearance. He is  obese.  Cardiovascular:     Rate and Rhythm: Normal rate and regular rhythm.     Pulses: Normal pulses.     Heart sounds: Normal heart sounds. No murmur.  Pulmonary:     Effort: Pulmonary effort is normal. No respiratory distress.     Breath sounds: Normal breath sounds.  Skin:    Capillary Refill: Capillary refill takes less than 2 seconds.  Neurological:     General: No focal deficit present.     Mental Status: He is alert and oriented to person, place, and time.     Cranial Nerves: No cranial nerve deficit.         Assessment And Plan:     1. Uncontrolled type 2 diabetes mellitus with hyperglycemia ([place])  Improved at last visit  Continue with current medications  I have sent him a new glucometer as he reports his is broke - Blood Glucose Monitoring Suppl (ONETOUCH VERIO) w/Device KIT; 1 Device by Does not apply route daily. Use as directed to check blood sugars 1 time daily  Dispense: 1 kit; Refill: 0 - glucose blood (ONETOUCH VERIO) test strip; Use as instructed to check blood sugars 1 time daily  Dispense: 150 each; Refill: 2 - Hemoglobin A1c  2. Essential hypertension . B/P is controlled.  . CMP ordered to check renal function.  . The importance of regular exercise and dietary modification was stressed to the patient.  - CMP14+EGFR  3. Erectile dysfunction, unspecified erectile dysfunction type  Will check testosterone levels as he has taken testosterone in the past.  - Testosterone, Total  4. Insomnia, unspecified type  Chronic   Refill sent to pharmacy he will not be able to pick up has had a recent refill - zolpidem (AMBIEN) 10 MG tablet; Take 1 tablet (10 mg total) by mouth at bedtime as needed for sleep.  Dispense: 30 tablet; Refill: 5   Minette Brine, FNP    THE PATIENT IS ENCOURAGED TO PRACTICE SOCIAL DISTANCING DUE TO THE COVID-19 PANDEMIC.

## 2019-06-25 LAB — TESTOSTERONE: Testosterone: 161 ng/dL — ABNORMAL LOW (ref 264–916)

## 2019-06-26 ENCOUNTER — Other Ambulatory Visit: Payer: Self-pay | Admitting: Nurse Practitioner

## 2019-06-26 DIAGNOSIS — N529 Male erectile dysfunction, unspecified: Secondary | ICD-10-CM

## 2019-06-26 DIAGNOSIS — R7989 Other specified abnormal findings of blood chemistry: Secondary | ICD-10-CM

## 2019-06-26 MED ORDER — TESTOSTERONE CYPIONATE 100 MG/ML IM SOLN: 100.0000 mg | INTRAMUSCULAR | 3 refills | Status: DC

## 2019-06-26 MED ORDER — "EASY TOUCH FLIPLOCK NEEDLES 25G X 1"" MISC": 3 refills | Status: DC

## 2019-06-27 LAB — SPECIMEN STATUS REPORT

## 2019-06-27 LAB — CMP14+EGFR
ALT: 26 IU/L (ref 0–44)
AST: 20 IU/L (ref 0–40)
Albumin/Globulin Ratio: 1.6 (ref 1.2–2.2)
Albumin: 4.1 g/dL (ref 3.8–4.9)
Alkaline Phosphatase: 92 IU/L (ref 48–121)
BUN/Creatinine Ratio: 12 (ref 9–20)
BUN: 12 mg/dL (ref 6–24)
Bilirubin Total: <0.2 mg/dL (ref 0.0–1.2)
CO2: 21 mmol/L (ref 20–29)
Calcium: 9.1 mg/dL (ref 8.7–10.2)
Chloride: 103 mmol/L (ref 96–106)
Creatinine, Ser: 1 mg/dL (ref 0.76–1.27)
GFR calc Af Amer: 97 mL/min/{1.73_m2} (ref >59)
GFR calc non Af Amer: 84 mL/min/{1.73_m2} (ref >59)
Globulin, Total: 2.6 g/dL (ref 1.5–4.5)
Glucose: 187 mg/dL — ABNORMAL HIGH (ref 65–99)
Potassium: 4.1 mmol/L (ref 3.5–5.2)
Sodium: 139 mmol/L (ref 134–144)
Total Protein: 6.7 g/dL (ref 6.0–8.5)

## 2019-08-18 ENCOUNTER — Encounter: Payer: Self-pay | Admitting: Nurse Practitioner

## 2019-08-18 ENCOUNTER — Ambulatory Visit: Payer: 59 | Admitting: Nurse Practitioner

## 2019-08-18 ENCOUNTER — Other Ambulatory Visit: Payer: Self-pay

## 2019-08-18 VITALS — BP 132/70 | HR 79 | Temp 98.1°F | Ht 67.8 in | Wt 227.0 lb

## 2019-08-18 DIAGNOSIS — I10 Essential (primary) hypertension: Secondary | ICD-10-CM

## 2019-08-18 DIAGNOSIS — R1907 Generalized intra-abdominal and pelvic swelling, mass and lump: Secondary | ICD-10-CM

## 2019-08-18 DIAGNOSIS — R7989 Other specified abnormal findings of blood chemistry: Secondary | ICD-10-CM

## 2019-08-18 DIAGNOSIS — Z0000 Encounter for general adult medical examination without abnormal findings: Secondary | ICD-10-CM

## 2019-08-18 DIAGNOSIS — Z125 Encounter for screening for malignant neoplasm of prostate: Secondary | ICD-10-CM

## 2019-08-18 DIAGNOSIS — E1165 Type 2 diabetes mellitus with hyperglycemia: Secondary | ICD-10-CM

## 2019-08-18 DIAGNOSIS — E559 Vitamin D deficiency, unspecified: Secondary | ICD-10-CM

## 2019-08-18 LAB — POCT URINALYSIS DIPSTICK
Bilirubin, UA: NEGATIVE
Blood, UA: NEGATIVE
Glucose, UA: POSITIVE — AB
Ketones, UA: NEGATIVE
Leukocytes, UA: NEGATIVE
Nitrite, UA: NEGATIVE
Protein, UA: POSITIVE — AB
Spec Grav, UA: >=1.03 — AB (ref 1.010–1.025)
Urobilinogen, UA: 0.2 E.U./dL
pH, UA: 5.5 (ref 5.0–8.0)

## 2019-08-18 LAB — POCT UA - MICROALBUMIN
Albumin/Creatinine Ratio, Urine, POC: 30
Creatinine, POC: 200 mg/dL
Microalbumin Ur, POC: 30 mg/L

## 2019-08-18 MED ORDER — ATORVASTATIN CALCIUM 10 MG PO TABS: ORAL_TABLET | ORAL | 1 refills | Status: DC

## 2019-08-18 NOTE — Patient Instructions (Signed)

## 2019-08-18 NOTE — Progress Notes (Signed)
This visit occurred during the SARS-CoV-2 public health emergency.  Safety protocols were in place, including screening questions prior to the visit, additional usage of staff PPE, and extensive cleaning of exam room while observing appropriate contact time as indicated for disinfecting solutions.  Subjective:     Patient ID: David Humphrey , male    DOB: 26-Feb-1960 , 59 y.o.   MRN: [id number]   Chief Complaint  Patient presents with  . Annual Exam  . Diabetes  . Hypertension    HPI  Presents today for his annual HM examination. He is down 4 pounds since 6/1 and reports that he is eating right and is going to gym. He is doing weighting training and cardio three days per week.   He is interested in a podiatrist referral. He is taking the Ambien half a pill nightly and is doing well with his sleep. He has plans to call Dr. Collene Mares for a new appointment for his colonoscopy and will think about the PNA vaccine at this time. He reports no fatigue and is taking his vitamin D and is getting some sun exposure on the weekend. Overall he is doing well with no new concerns at this time.   Wt Readings from Last 3 Encounters: 08/18/19 : (!) 227 lb (103 kg) 06/24/19 : 231 lb (104.8 kg) 01/30/19 : 242 lb 6.4 oz (110 kg)  Diabetes He presents for his follow-up diabetic visit. He has type 2 diabetes mellitus. No MedicAlert identification noted. His disease course has been improving. Pertinent negatives for hypoglycemia include no headaches or hunger. Pertinent negatives for diabetes include no blurred vision, no chest pain, no foot paresthesias, no polydipsia, no polyphagia and no polyuria. Symptoms are improving. Current diabetic treatment includes oral agent (dual therapy). He is compliant with treatment all of the time. His weight is stable. He is following a generally healthy diet. Meal planning includes avoidance of concentrated sweets. He has not had a previous visit with a dietitian. He participates in  exercise three times a week. His breakfast blood glucose is taken between 6-7 am. His breakfast blood glucose range is generally 110-130 mg/dl. An ACE inhibitor/angiotensin II receptor blocker is being taken. He does not see a podiatrist (He cuts his own nails).Eye exam is current (last in January).  Hypertension This is a chronic problem. The current episode started more than 1 year ago. The problem has been gradually improving since onset. The problem is controlled. Pertinent negatives include no blurred vision, chest pain, headaches or palpitations. There are no associated agents to hypertension. Risk factors for coronary artery disease include diabetes mellitus, obesity and sedentary lifestyle. Past treatments include calcium channel blockers. The current treatment provides moderate improvement. There are no compliance problems.      Past Medical History:  Diagnosis Date  . Diabetes mellitus without complication ([place])   . Hypertension      Family History  Problem Relation Age of Onset  . Hypertension Mother   . Kidney disease Mother   . Cancer Father   . Cancer Paternal Grandmother      Current Outpatient Medications:  .  Blood Glucose Monitoring Suppl (ONETOUCH VERIO) w/Device KIT, 1 Device by Does not apply route daily. Use as directed to check blood sugars 1 time daily, Disp: 1 kit, Rfl: 0 .  glucose blood (ONETOUCH VERIO) test strip, Use as instructed to check blood sugars 1 time daily, Disp: 150 each, Rfl: 2 .  ibuprofen (ADVIL,MOTRIN) 600 MG tablet, Take 1 tablet (  600 mg total) by mouth every 6 (six) hours as needed., Disp: 30 tablet, Rfl: 0 .  linaGLIPtin-metFORMIN HCl ER (JENTADUETO XR) 2.05-998 MG TB24, Take 1 tablet by mouth daily., Disp: 90 tablet, Rfl: 0 .  lisinopril (ZESTRIL) 10 MG tablet, TAKE 1 TABLET BY MOUTH EVERY DAY, Disp: 30 tablet, Rfl: 5 .  NEEDLE, DISP, 25 G (EASY TOUCH FLIPLOCK NEEDLES) 25G X 1" MISC, Use with testosterone 2 times a week, Disp: 50 each, Rfl:  3 .  testosterone cypionate (DEPOTESTOTERONE CYPIONATE) 100 MG/ML injection, Inject 1 mL (100 mg total) into the muscle 2 (two) times a week. For IM use only, Disp: 10 mL, Rfl: 3 .  Vitamin D, Ergocalciferol, (DRISDOL) 1.25 MG (50000 UNIT) CAPS capsule, Take 1 capsule (50,000 Units total) by mouth every 7 (seven) days., Disp: 12 capsule, Rfl: 1 .  zolpidem (AMBIEN) 10 MG tablet, Take 1 tablet (10 mg total) by mouth at bedtime as needed for sleep., Disp: 30 tablet, Rfl: 5 .  atorvastatin (LIPITOR) 10 MG tablet, Take 1 tablet by mouth MWF, Disp: 36 tablet, Rfl: 1   No Known Allergies   Men's preventive visit. Patient Health Questionnaire (PHQ-2) is    Office Visit from 01/30/2019 in [facility]  PHQ-2 Total Score 0     Patient is on a Regular diet. Marital status: Single. Relevant history for alcohol use is:  Social History   Substance and Sexual Activity  Alcohol Use Yes   Comment: socially   Relevant history for tobacco use is:  Social History   Tobacco Use  Smoking Status Never Smoker  Smokeless Tobacco Never Used  .   Review of Systems  Constitutional: Negative.   HENT: Negative.   Eyes: Negative.  Negative for blurred vision.  Respiratory: Negative.   Cardiovascular: Negative for chest pain and palpitations.  Gastrointestinal: Negative.   Endocrine: Negative.  Negative for polydipsia, polyphagia and polyuria.  Musculoskeletal: Negative.   Skin: Negative.   Allergic/Immunologic: Negative.   Neurological: Negative.  Negative for headaches.  Hematological: Negative.   Psychiatric/Behavioral: Negative.      Today's Vitals   08/18/19 0952  BP: (!) 132/70  Pulse: 79  Temp: 98.1 F (36.7 C)  TempSrc: Oral  SpO2: 98%  Weight: (!) 227 lb (103 kg)  Height: 5' 7.8" (1.722 m)  PainSc: 0-No pain   Body mass index is 34.72 kg/m.   Objective:  Physical Exam Constitutional:      General: He is not in acute distress.    Appearance: Normal  appearance. He is obese. He is not ill-appearing.  HENT:     Head: Atraumatic.     Right Ear: Tympanic membrane, ear canal and external ear normal.     Left Ear: Tympanic membrane, ear canal and external ear normal.     Nose:     Comments: Deferred wearing mask    Mouth/Throat:     Comments: Deferred wearing mask Eyes:     Extraocular Movements: Extraocular movements intact.     Conjunctiva/sclera: Conjunctivae normal.     Pupils: Pupils are equal, round, and reactive to light.  Neck:     Vascular: No carotid bruit.  Cardiovascular:     Rate and Rhythm: Normal rate and regular rhythm.     Pulses: Normal pulses.     Heart sounds: Normal heart sounds. No murmur heard.   Pulmonary:     Effort: Pulmonary effort is normal. No respiratory distress.     Breath sounds: Normal  breath sounds.  Abdominal:     General: Bowel sounds are normal. There is distension.     Palpations: Abdomen is soft.     Tenderness: There is no abdominal tenderness. There is no guarding or rebound.     Hernia: A hernia is present.     Comments: Ventral Hernia present  Musculoskeletal:        General: Normal range of motion.     Cervical back: Normal range of motion and neck supple. No tenderness.  Feet:     Right foot:     Skin integrity: Callus and dry skin present.     Toenail Condition: Fungal disease present.    Left foot:     Skin integrity: Callus and dry skin present.     Toenail Condition: Fungal disease present. Lymphadenopathy:     Cervical: No cervical adenopathy.  Skin:    General: Skin is warm and dry.     Capillary Refill: Capillary refill takes less than 2 seconds.     Findings: Rash present.     Comments: Onychomycosis present on bilateral toes   Neurological:     General: No focal deficit present.     Mental Status: He is alert and oriented to person, place, and time.  Psychiatric:        Mood and Affect: Mood normal.        Behavior: Behavior normal.        Thought Content:  Thought content normal.        Judgment: Judgment normal.         Assessment And Plan:    1. Encounter for health maintenance examination . Behavior modifications discussed and diet history reviewed.   . Pt will continue to exercise regularly and modify diet with low GI, plant based foods and decrease intake of processed foods.  . Recommend intake of daily multivitamin, Vitamin D, and calcium.  . Recommend mammogram and colonoscopy for preventive screenings, as well as recommend immunizations that include influenza, TDAP  Declined pneumonia vaccine, discussed the risk of pneumonia with patients with chronic health conditions  2. Encounter for prostate cancer screening  - PSA  3. Type 2 diabetes mellitus with hyperglycemia, without long-term current use of insulin (HCC)  Chronic, stable  Continue with current medications  Encouraged to limit intake of sugary foods and drinks  Encouraged to increase physical activity to 150 minutes per week  Diabetic foot exam done no abnormal findings - Hemoglobin A1c - CBC - CMP14+EGFR - Lipid panel - POCT Urinalysis Dipstick (81002) - POCT UA - Microalbumin - EKG 12-Lead - Ambulatory referral to Podiatry  4. Essential hypertension . B/P is controlled.  . CMP ordered to check renal function.  . The importance of regular exercise and dietary modification was stressed to the patient.  . Stressed importance of losing ten percent of her body weight to help with B/P control.  . The weight loss would help with decreasing cardiac and cancer risk as well.  . EKG done with NSR HR 75 - CMP14+EGFR - EKG 12-Lead  5. Low testosterone in male  He is doing well with testosterone injections - Testosterone, Total  6. Vitamin D deficiency  Will check vitamin D level and supplement as needed.     Also encouraged to spend 15 minutes in the sun daily.  - VITAMIN D 25 Hydroxy (Vit-D Deficiency, Fractures)  7. Generalized swelling, mass, or  lump of abdomen or pelvis  Soft bulge present to mid abdomen  Will send for ultrasound  Discussed with him the importance to work on weight loss this will help to decrease the size of the bulge if this is a hernia - US Abdomen Complete; Future     Patient was given opportunity to ask questions. Patient verbalized understanding of the plan and was able to repeat key elements of the plan. All questions were answered to their satisfaction.   Minette Brine, FNP   I, Minette Brine, FNP, have reviewed all documentation for this visit. The documentation on 08/18/19 for the exam, diagnosis, procedures, and orders are all accurate and complete.  THE PATIENT IS ENCOURAGED TO PRACTICE SOCIAL DISTANCING DUE TO THE COVID-19 PANDEMIC.

## 2019-08-19 ENCOUNTER — Telehealth: Payer: Self-pay

## 2019-08-19 LAB — CBC
Hematocrit: 41.8 % (ref 37.5–51.0)
Hemoglobin: 13.9 g/dL (ref 13.0–17.7)
MCH: 30 pg (ref 26.6–33.0)
MCHC: 33.3 g/dL (ref 31.5–35.7)
MCV: 90 fL (ref 79–97)
Platelets: 239 10*3/uL (ref 150–450)
RBC: 4.64 x10E6/uL (ref 4.14–5.80)
RDW: 13.5 % (ref 11.6–15.4)
WBC: 6.3 10*3/uL (ref 3.4–10.8)

## 2019-08-19 LAB — CMP14+EGFR
ALT: 28 IU/L (ref 0–44)
AST: 22 IU/L (ref 0–40)
Albumin/Globulin Ratio: 1.4 (ref 1.2–2.2)
Albumin: 4.2 g/dL (ref 3.8–4.9)
Alkaline Phosphatase: 96 IU/L (ref 48–121)
BUN/Creatinine Ratio: 10 (ref 9–20)
BUN: 11 mg/dL (ref 6–24)
Bilirubin Total: 0.3 mg/dL (ref 0.0–1.2)
CO2: 23 mmol/L (ref 20–29)
Calcium: 9.5 mg/dL (ref 8.7–10.2)
Chloride: 101 mmol/L (ref 96–106)
Creatinine, Ser: 1.13 mg/dL (ref 0.76–1.27)
GFR calc Af Amer: 84 mL/min/{1.73_m2} (ref >59)
GFR calc non Af Amer: 72 mL/min/{1.73_m2} (ref >59)
Globulin, Total: 2.9 g/dL (ref 1.5–4.5)
Glucose: 193 mg/dL — ABNORMAL HIGH (ref 65–99)
Potassium: 4.4 mmol/L (ref 3.5–5.2)
Sodium: 141 mmol/L (ref 134–144)
Total Protein: 7.1 g/dL (ref 6.0–8.5)

## 2019-08-19 LAB — LIPID PANEL
Chol/HDL Ratio: 3.6 ratio (ref 0.0–5.0)
Cholesterol, Total: 153 mg/dL (ref 100–199)
HDL: 43 mg/dL (ref >39)
LDL Chol Calc (NIH): 85 mg/dL (ref 0–99)
Triglycerides: 140 mg/dL (ref 0–149)
VLDL Cholesterol Cal: 25 mg/dL (ref 5–40)

## 2019-08-19 LAB — HEMOGLOBIN A1C
Est. average glucose Bld gHb Est-mCnc: 180 mg/dL
Hgb A1c MFr Bld: 7.9 % — ABNORMAL HIGH (ref 4.8–5.6)

## 2019-08-19 LAB — VITAMIN D 25 HYDROXY (VIT D DEFICIENCY, FRACTURES): Vit D, 25-Hydroxy: 22.5 ng/mL — ABNORMAL LOW (ref 30.0–100.0)

## 2019-08-19 LAB — TESTOSTERONE: Testosterone: 140 ng/dL — ABNORMAL LOW (ref 264–916)

## 2019-08-19 LAB — PSA: Prostate Specific Ag, Serum: 1 ng/mL (ref 0.0–4.0)

## 2019-08-19 NOTE — Telephone Encounter (Signed)
Erica from [facility] imaging called regarding patient. They scheduled him for an ultrasound abdomen but she had some questions because you put something regarding a hernia and if it is something pertaining that the order needs to be ultrasound limited. [phone]

## 2019-08-20 ENCOUNTER — Telehealth: Payer: Self-pay

## 2019-08-20 ENCOUNTER — Other Ambulatory Visit: Payer: Self-pay | Admitting: Nurse Practitioner

## 2019-08-20 DIAGNOSIS — R1907 Generalized intra-abdominal and pelvic swelling, mass and lump: Secondary | ICD-10-CM

## 2019-08-20 NOTE — Telephone Encounter (Signed)
Erica from [facility] called stating patient is scheduled for an appointment on 08/25/19 for abdomen ultrasound but the provider put something pertaining to a hernia in the notes so the order needs to be switched to ultrasound limited. I returned her call at [phone] and advised her that the provider has put in a new order. YL,RMA

## 2019-08-25 ENCOUNTER — Other Ambulatory Visit: Payer: 59

## 2019-08-25 ENCOUNTER — Ambulatory Visit
Admission: RE | Admit: 2019-08-25 | Discharge: 2019-08-25 | Disposition: A | Discharge status: Home / Self Care | Payer: 59 | Source: Ambulatory Visit | Attending: Nurse Practitioner | Admitting: Nurse Practitioner

## 2019-08-25 DIAGNOSIS — R1907 Generalized intra-abdominal and pelvic swelling, mass and lump: Secondary | ICD-10-CM

## 2019-09-01 ENCOUNTER — Telehealth: Payer: Self-pay

## 2019-09-01 ENCOUNTER — Other Ambulatory Visit: Payer: Self-pay | Admitting: Nurse Practitioner

## 2019-09-01 DIAGNOSIS — E559 Vitamin D deficiency, unspecified: Secondary | ICD-10-CM

## 2019-09-01 MED ORDER — VITAMIN D (ERGOCALCIFEROL) 1.25 MG (50000 UNIT) PO CAPS: 50000.0000 [IU] | ORAL_CAPSULE | ORAL | 1 refills | Status: DC

## 2019-09-01 NOTE — Telephone Encounter (Signed)
Patient called requesting his bloodwork results and also results from his ultrasound. YL,RMA

## 2019-09-02 NOTE — Progress Notes (Signed)
Okay, thank you we will recheck in 3 months.

## 2019-11-09 ENCOUNTER — Other Ambulatory Visit: Payer: Self-pay | Admitting: Internal Medicine

## 2019-11-24 ENCOUNTER — Other Ambulatory Visit: Payer: Self-pay | Admitting: Nurse Practitioner

## 2019-12-15 ENCOUNTER — Ambulatory Visit: Payer: 59 | Admitting: Nurse Practitioner

## 2019-12-15 ENCOUNTER — Telehealth: Payer: Self-pay

## 2019-12-15 NOTE — Telephone Encounter (Signed)
Called pt to reschedule appt do to provider being out of the office LVM

## 2019-12-17 ENCOUNTER — Encounter: Payer: 59 | Admitting: Nurse Practitioner

## 2020-01-01 ENCOUNTER — Other Ambulatory Visit: Payer: Self-pay | Admitting: Nurse Practitioner

## 2020-01-01 DIAGNOSIS — G4700 Insomnia, unspecified: Secondary | ICD-10-CM

## 2020-01-01 NOTE — Telephone Encounter (Signed)
Ambien refill 

## 2020-01-05 ENCOUNTER — Telehealth: Payer: Self-pay

## 2020-01-05 ENCOUNTER — Other Ambulatory Visit: Payer: Self-pay | Admitting: Nurse Practitioner

## 2020-01-05 DIAGNOSIS — G4700 Insomnia, unspecified: Secondary | ICD-10-CM

## 2020-01-05 NOTE — Telephone Encounter (Signed)
Yes he did, ok I'll let him know he needs an appt thx

## 2020-01-05 NOTE — Telephone Encounter (Signed)
Pt requesting ambien refill.  

## 2020-01-07 ENCOUNTER — Ambulatory Visit: Payer: 59 | Admitting: Nurse Practitioner

## 2020-01-07 ENCOUNTER — Encounter: Payer: Self-pay | Admitting: Nurse Practitioner

## 2020-01-07 ENCOUNTER — Other Ambulatory Visit: Payer: Self-pay

## 2020-01-07 VITALS — BP 136/80 | HR 82 | Temp 98.1°F | Ht 67.8 in | Wt 231.0 lb

## 2020-01-07 DIAGNOSIS — G4700 Insomnia, unspecified: Secondary | ICD-10-CM | POA: Diagnosis not present

## 2020-01-07 DIAGNOSIS — E119 Type 2 diabetes mellitus without complications: Secondary | ICD-10-CM

## 2020-01-07 DIAGNOSIS — I10 Essential (primary) hypertension: Secondary | ICD-10-CM | POA: Diagnosis not present

## 2020-01-07 DIAGNOSIS — E559 Vitamin D deficiency, unspecified: Secondary | ICD-10-CM | POA: Diagnosis not present

## 2020-01-07 DIAGNOSIS — R1907 Generalized intra-abdominal and pelvic swelling, mass and lump: Secondary | ICD-10-CM

## 2020-01-07 MED ORDER — ZOLPIDEM TARTRATE 10 MG PO TABS: 10.0000 mg | ORAL_TABLET | Freq: Every evening | ORAL | 5 refills | Status: DC | PRN

## 2020-01-07 NOTE — Telephone Encounter (Signed)
Ambien refill 

## 2020-01-07 NOTE — Progress Notes (Signed)
I,Yamilka Roman [employer] as a Education administrator for [employer], FNP.,have documented all relevant documentation on the behalf of Minette Brine, FNP,as directed by  Minette Brine, FNP while in the presence of Minette Brine, [place]. This visit occurred during the SARS-CoV-2 public health emergency.  Safety protocols were in place, including screening questions prior to the visit, additional usage of staff PPE, and extensive cleaning of exam room while observing appropriate contact time as indicated for disinfecting solutions.  Subjective:     Patient ID: David Humphrey , male    DOB: [birth year] , 59 y.o.   MRN: [id number]   Chief Complaint  Patient presents with  . Insomnia    HPI  Patient here for a f/u on his insomnia. He is currently taking Ambien he stated it is working well for him.  He averages taking his ambien at least 4 times a week. Continues to exercise.   He is taking jetudendo he would like to restart janumet due to it was effective.   Insomnia Primary symptoms: frequent awakening.  PMH includes: no hypertension.  Diabetes He presents for his follow-up diabetic visit. He has type 2 diabetes mellitus. There are no hypoglycemic associated symptoms. Pertinent negatives for diabetes include no blurred vision. There are no hypoglycemic complications.     Past Medical History:  Diagnosis Date  . Diabetes mellitus without complication ([place])   . Hypertension      Family History  Problem Relation Age of Onset  . Hypertension Mother   . Kidney disease Mother   . Cancer Father   . Cancer Paternal Grandmother      Current Outpatient Medications:  .  atorvastatin (LIPITOR) 10 MG tablet, Take 1 tablet by mouth MWF, Disp: 36 tablet, Rfl: 1 .  Blood Glucose Monitoring Suppl (ONETOUCH VERIO) w/Device KIT, 1 Device by Does not apply route daily. Use as directed to check blood sugars 1 time daily, Disp: 1 kit, Rfl: 0 .  glucose blood (ONETOUCH VERIO) test strip, Use as instructed to check  blood sugars 1 time daily, Disp: 150 each, Rfl: 2 .  ibuprofen (ADVIL,MOTRIN) 600 MG tablet, Take 1 tablet (600 mg total) by mouth every 6 (six) hours as needed., Disp: 30 tablet, Rfl: 0 .  linaGLIPtin-metFORMIN HCl ER (JENTADUETO XR) 2.05-998 MG TB24, Take 1 tablet by mouth daily., Disp: 90 tablet, Rfl: 0 .  lisinopril (ZESTRIL) 10 MG tablet, TAKE 1 TABLET BY MOUTH EVERY DAY, Disp: 30 tablet, Rfl: 5 .  NEEDLE, DISP, 25 G (EASY TOUCH FLIPLOCK NEEDLES) 25G X 1" MISC, Use with testosterone 2 times a week, Disp: 50 each, Rfl: 3 .  testosterone cypionate (DEPOTESTOTERONE CYPIONATE) 100 MG/ML injection, Inject 1 mL (100 mg total) into the muscle 2 (two) times a week. For IM use only, Disp: 10 mL, Rfl: 3 .  Vitamin D, Ergocalciferol, (DRISDOL) 1.25 MG (50000 UNIT) CAPS capsule, Take 1 capsule (50,000 Units total) by mouth 2 (two) times a week., Disp: 24 capsule, Rfl: 1 .  zolpidem (AMBIEN) 10 MG tablet, Take 1 tablet (10 mg total) by mouth at bedtime as needed for sleep., Disp: 30 tablet, Rfl: 5   No Known Allergies   Review of Systems  Constitutional: Negative.   HENT: Negative.   Eyes: Negative for blurred vision.  Respiratory: Negative.   Cardiovascular: Negative.   Gastrointestinal: Negative.   Endocrine: Negative.   Genitourinary: Negative.   Musculoskeletal: Negative.   Skin: Negative.   Neurological: Negative.   Hematological: Negative.   Psychiatric/Behavioral: The  patient has insomnia.      Today's Vitals   01/07/20 0952  BP: 136/80  Pulse: 82  Temp: 98.1 F (36.7 C)  TempSrc: Oral  Weight: 231 lb (104.8 kg)  Height: 5' 7.8" (1.722 m)   Body mass index is 35.33 kg/m.   Objective:  Physical Exam Vitals reviewed.  Constitutional:      Appearance: Normal appearance. He is obese.  Cardiovascular:     Rate and Rhythm: Normal rate and regular rhythm.     Pulses: Normal pulses.     Heart sounds: Normal heart sounds. No murmur heard.   Pulmonary:     Effort: No  respiratory distress.     Breath sounds: Normal breath sounds.  Musculoskeletal:        General: No swelling.  Skin:    General: Skin is warm and dry.     Capillary Refill: Capillary refill takes less than 2 seconds.  Neurological:     General: No focal deficit present.     Mental Status: He is alert and oriented to person, place, and time.  Psychiatric:        Mood and Affect: Mood normal.        Behavior: Behavior normal.        Thought Content: Thought content normal.        Judgment: Judgment normal.         Assessment And Plan:     1. Type 2 diabetes mellitus without complication, without long-term current use of insulin (HCC)  Chronic, blood sugars are controlled however most recent HgbA1c is at 7.2  He would like to change back to Janumet at the first of the year when his insurance changes as he felt this was more effective. He is encouraged to check the formulary in advance  Will request his records from Dr. Katy Fitch - Hemoglobin A1c - CMP14+EGFR  2. Essential hypertension  Chronic, fair control  Continue with current medications - CMP14+EGFR  3. Insomnia, unspecified type  Chronic, continues to do well with taking 2-3 times a week on average - zolpidem (AMBIEN) 10 MG tablet; Take 1 tablet (10 mg total) by mouth at bedtime as needed for sleep.  Dispense: 30 tablet; Refill: 5  4. Vitamin D deficiency  Will check vitamin D level and supplement as needed.     Also encouraged to spend 15 minutes in the sun daily.  - VITAMIN D 25 Hydroxy (Vit-D Deficiency, Fractures)  5. Generalized swelling, mass, or lump of abdomen or pelvis  Ultrasound was normal however continues to have a protrusion and firm area to abdomen  Will order CT scan of abdomen - CT Abdomen Pelvis Wo Contrast; Future   Declines influenza vaccine, he is also encouraged to call Dr. Collene Mares to reschedule his colonoscopy  Patient was given opportunity to ask questions. Patient verbalized  understanding of the plan and was able to repeat key elements of the plan. All questions were answered to their satisfaction.    Teola Bradley, FNP, have reviewed all documentation for this visit. The documentation on 01/07/20 for the exam, diagnosis, procedures, and orders are all accurate and complete.  THE PATIENT IS ENCOURAGED TO PRACTICE SOCIAL DISTANCING DUE TO THE COVID-19 PANDEMIC.

## 2020-01-07 NOTE — Patient Instructions (Signed)

## 2020-01-08 LAB — CMP14+EGFR
ALT: 34 IU/L (ref 0–44)
AST: 31 IU/L (ref 0–40)
Albumin/Globulin Ratio: 1.5 (ref 1.2–2.2)
Albumin: 4.3 g/dL (ref 3.8–4.9)
Alkaline Phosphatase: 98 IU/L (ref 44–121)
BUN/Creatinine Ratio: 8 — ABNORMAL LOW (ref 9–20)
BUN: 8 mg/dL (ref 6–24)
Bilirubin Total: 0.4 mg/dL (ref 0.0–1.2)
CO2: 20 mmol/L (ref 20–29)
Calcium: 9 mg/dL (ref 8.7–10.2)
Chloride: 99 mmol/L (ref 96–106)
Creatinine, Ser: 0.97 mg/dL (ref 0.76–1.27)
GFR calc Af Amer: 100 mL/min/{1.73_m2} (ref >59)
GFR calc non Af Amer: 86 mL/min/{1.73_m2} (ref >59)
Globulin, Total: 2.9 g/dL (ref 1.5–4.5)
Glucose: 160 mg/dL — ABNORMAL HIGH (ref 65–99)
Potassium: 3.7 mmol/L (ref 3.5–5.2)
Sodium: 137 mmol/L (ref 134–144)
Total Protein: 7.2 g/dL (ref 6.0–8.5)

## 2020-01-08 LAB — VITAMIN D 25 HYDROXY (VIT D DEFICIENCY, FRACTURES): Vit D, 25-Hydroxy: 28.6 ng/mL — ABNORMAL LOW (ref 30.0–100.0)

## 2020-01-08 LAB — HEMOGLOBIN A1C
Est. average glucose Bld gHb Est-mCnc: 169 mg/dL
Hgb A1c MFr Bld: 7.5 % — ABNORMAL HIGH (ref 4.8–5.6)

## 2020-01-26 ENCOUNTER — Other Ambulatory Visit: Payer: Self-pay | Admitting: Nurse Practitioner

## 2020-01-26 DIAGNOSIS — E1165 Type 2 diabetes mellitus with hyperglycemia: Secondary | ICD-10-CM

## 2020-01-28 ENCOUNTER — Telehealth: Payer: Self-pay | Admitting: Nurse Practitioner

## 2020-01-28 NOTE — Telephone Encounter (Signed)
Called to do peer to peer done with Dr. Zachery Conch, approved will await the approval number.

## 2020-01-29 ENCOUNTER — Other Ambulatory Visit: Payer: 59

## 2020-02-19 ENCOUNTER — Other Ambulatory Visit: Payer: Self-pay

## 2020-02-19 MED ORDER — JANUMET 50-1000 MG PO TABS: 1.0000 | ORAL_TABLET | Freq: Two times a day (BID) | ORAL | 0 refills | Status: DC

## 2020-02-26 ENCOUNTER — Ambulatory Visit
Admission: RE | Admit: 2020-02-26 | Discharge: 2020-02-26 | Disposition: A | Discharge status: Home / Self Care | Payer: BC Managed Care – PPO | Source: Ambulatory Visit | Attending: Nurse Practitioner | Admitting: Nurse Practitioner

## 2020-02-26 ENCOUNTER — Other Ambulatory Visit: Payer: Self-pay

## 2020-02-26 DIAGNOSIS — R1907 Generalized intra-abdominal and pelvic swelling, mass and lump: Secondary | ICD-10-CM

## 2020-02-26 DIAGNOSIS — K760 Fatty (change of) liver, not elsewhere classified: Secondary | ICD-10-CM | POA: Diagnosis not present

## 2020-03-16 ENCOUNTER — Other Ambulatory Visit: Payer: Self-pay

## 2020-03-16 DIAGNOSIS — E119 Type 2 diabetes mellitus without complications: Secondary | ICD-10-CM

## 2020-03-16 DIAGNOSIS — K429 Umbilical hernia without obstruction or gangrene: Secondary | ICD-10-CM

## 2020-03-22 ENCOUNTER — Telehealth: Payer: Self-pay

## 2020-03-22 NOTE — Telephone Encounter (Signed)
Pt called regarding referral that was placed he has yet to hear anything from [place] about his hernia. Pt also asked about the cyst, what's the next steps?   Per JM: You possibly have cyst to your kidneys - a nephrologist can do additional testing to evaluate further if you are interested  Yes pt is interested in seeing an nephrologist

## 2020-04-02 ENCOUNTER — Other Ambulatory Visit: Payer: Self-pay | Admitting: Nurse Practitioner

## 2020-04-02 DIAGNOSIS — N281 Cyst of kidney, acquired: Secondary | ICD-10-CM

## 2020-04-02 NOTE — Telephone Encounter (Signed)
Referral placed for nephrology.

## 2020-04-06 ENCOUNTER — Other Ambulatory Visit: Payer: Self-pay

## 2020-04-06 ENCOUNTER — Ambulatory Visit (INDEPENDENT_AMBULATORY_CARE_PROVIDER_SITE_OTHER): Payer: BC Managed Care – PPO | Admitting: Nurse Practitioner

## 2020-04-06 ENCOUNTER — Encounter: Payer: Self-pay | Admitting: Nurse Practitioner

## 2020-04-06 VITALS — BP 130/82 | HR 78 | Temp 98.1°F | Ht 67.8 in | Wt 221.8 lb

## 2020-04-06 DIAGNOSIS — G4700 Insomnia, unspecified: Secondary | ICD-10-CM | POA: Diagnosis not present

## 2020-04-06 DIAGNOSIS — I10 Essential (primary) hypertension: Secondary | ICD-10-CM | POA: Diagnosis not present

## 2020-04-06 DIAGNOSIS — N529 Male erectile dysfunction, unspecified: Secondary | ICD-10-CM

## 2020-04-06 DIAGNOSIS — E559 Vitamin D deficiency, unspecified: Secondary | ICD-10-CM | POA: Diagnosis not present

## 2020-04-06 DIAGNOSIS — Z1211 Encounter for screening for malignant neoplasm of colon: Secondary | ICD-10-CM

## 2020-04-06 DIAGNOSIS — B351 Tinea unguium: Secondary | ICD-10-CM

## 2020-04-06 DIAGNOSIS — E119 Type 2 diabetes mellitus without complications: Secondary | ICD-10-CM | POA: Diagnosis not present

## 2020-04-06 MED ORDER — SILDENAFIL CITRATE 100 MG PO TABS
100.0000 mg | ORAL_TABLET | ORAL | 1 refills | Status: DC | PRN
Start: 2020-04-06 — End: 2021-03-15

## 2020-04-06 MED ORDER — ZOLPIDEM TARTRATE 10 MG PO TABS
10.0000 mg | ORAL_TABLET | Freq: Every evening | ORAL | 5 refills | Status: DC | PRN
Start: 2020-04-06 — End: 2020-10-14

## 2020-04-06 MED ORDER — TERBINAFINE HCL 250 MG PO TABS
250.0000 mg | ORAL_TABLET | Freq: Every day | ORAL | 0 refills | Status: AC
Start: 2020-04-06 — End: 2020-06-29

## 2020-04-06 MED ORDER — VITAMIN D (ERGOCALCIFEROL) 1.25 MG (50000 UNIT) PO CAPS: 50000.0000 [IU] | ORAL_CAPSULE | ORAL | 1 refills | Status: DC

## 2020-04-06 NOTE — Progress Notes (Signed)
I,Yamilka Roman [employer] as a Education administrator for [employer], FNP.,have documented all relevant documentation on the behalf of Minette Brine, FNP,as directed by  Minette Brine, FNP while in the presence of Minette Brine, [place]. This visit occurred during the SARS-CoV-2 public health emergency.  Safety protocols were in place, including screening questions prior to the visit, additional usage of staff PPE, and extensive cleaning of exam room while observing appropriate contact time as indicated for disinfecting solutions.  Subjective:     Patient ID: David Humphrey , male    DOB: Aug 12, 1960 , 60 y.o.   MRN: [id number]   Chief Complaint  Patient presents with  . Insomnia  . Diabetes    HPI  Patient here for a f/u on his insomnia. He stated the Ambien is working well for him.  He is planning to pay [facility] he will schedule an appt.   Wt Readings from Last 3 Encounters: 04/06/20 : 221 lb 12.8 oz (100.6 kg) 01/07/20 : 231 lb (104.8 kg) 08/18/19 : (!) 227 lb (103 kg)  Insomnia Primary symptoms: sleep disturbance, frequent awakening.  PMH includes: no hypertension.  Diabetes He presents for his follow-up diabetic visit. He has type 2 diabetes mellitus. There are no hypoglycemic associated symptoms. Pertinent negatives for diabetes include no blurred vision, no polydipsia, no polyphagia and no polyuria. There are no hypoglycemic complications. There are no diabetic complications. There are no known risk factors for coronary artery disease. When asked about current treatments, none were reported. He is compliant with treatment all of the time. He is following a generally healthy diet. He has not had a previous visit with a dietitian. (Blood sugars average 220 at its highest.  ) He does not see a podiatrist.Eye exam is not current (he had a call from an opthalmologist for his eye exam).     Past Medical History:  Diagnosis Date  . Diabetes mellitus without complication ([place])    . Hypertension      Family History  Problem Relation Age of Onset  . Hypertension Mother   . Kidney disease Mother   . Cancer Father   . Cancer Paternal Grandmother      Current Outpatient Medications:  .  atorvastatin (LIPITOR) 10 MG tablet, Take 1 tablet by mouth MWF, Disp: 36 tablet, Rfl: 1 .  Blood Glucose Monitoring Suppl (ONETOUCH VERIO) w/Device KIT, 1 Device by Does not apply route daily. Use as directed to check blood sugars 1 time daily, Disp: 1 kit, Rfl: 0 .  glucose blood (ONETOUCH VERIO) test strip, Use as instructed to check blood sugars 1 time daily, Disp: 150 each, Rfl: 2 .  ibuprofen (ADVIL,MOTRIN) 600 MG tablet, Take 1 tablet (600 mg total) by mouth every 6 (six) hours as needed., Disp: 30 tablet, Rfl: 0 .  lisinopril (ZESTRIL) 10 MG tablet, TAKE 1 TABLET BY MOUTH EVERY DAY, Disp: 30 tablet, Rfl: 5 .  NEEDLE, DISP, 25 G (EASY TOUCH FLIPLOCK NEEDLES) 25G X 1" MISC, Use with testosterone 2 times a week, Disp: 50 each, Rfl: 3 .  sildenafil (VIAGRA) 100 MG tablet, Take 1 tablet (100 mg total) by mouth as needed for erectile dysfunction., Disp: 20 tablet, Rfl: 1 .  sitaGLIPtin-metformin (JANUMET) 50-1000 MG tablet, Take 1 tablet by mouth 2 (two) times daily with a meal., Disp: 180 tablet, Rfl: 0 .  terbinafine (LAMISIL) 250 MG tablet, Take 1 tablet (250 mg total) by mouth daily., Disp: 90 tablet, Rfl: 0 .  testosterone cypionate (  DEPOTESTOTERONE CYPIONATE) 100 MG/ML injection, Inject 1 mL (100 mg total) into the muscle 2 (two) times a week. For IM use only, Disp: 10 mL, Rfl: 3 .  Semaglutide (RYBELSUS) 7 MG TABS, Take 1 tablet by mouth daily. Take 30 minutes before breakfast, Disp: 90 tablet, Rfl: 0 .  Vitamin D, Ergocalciferol, (DRISDOL) 1.25 MG (50000 UNIT) CAPS capsule, Take 1 capsule (50,000 Units total) by mouth 2 (two) times a week., Disp: 24 capsule, Rfl: 1 .  zolpidem (AMBIEN) 10 MG tablet, Take 1 tablet (10 mg total) by mouth at bedtime as needed for sleep., Disp: 30  tablet, Rfl: 5   No Known Allergies   Review of Systems  Constitutional: Negative.   HENT: Negative.   Eyes: Negative.  Negative for blurred vision.  Respiratory: Negative.   Cardiovascular: Negative.   Gastrointestinal: Negative.   Endocrine: Negative for polydipsia, polyphagia and polyuria.  Genitourinary: Negative.   Musculoskeletal: Negative.   Skin: Negative.   Neurological: Negative.   Hematological: Negative.   Psychiatric/Behavioral: Positive for sleep disturbance. The patient has insomnia.      Today's Vitals   04/06/20 1006  BP: 130/82  Pulse: 78  Temp: 98.1 F (36.7 C)  TempSrc: Oral  Weight: 221 lb 12.8 oz (100.6 kg)  Height: 5' 7.8" (1.722 m)  PainSc: 0-No pain   Body mass index is 33.92 kg/m.   Objective:  Physical Exam Constitutional:      General: He is not in acute distress.    Appearance: Normal appearance. He is obese.  Cardiovascular:     Rate and Rhythm: Normal rate and regular rhythm.     Pulses: Normal pulses.     Heart sounds: Normal heart sounds.  Pulmonary:     Effort: Pulmonary effort is normal.     Breath sounds: Normal breath sounds.  Abdominal:     General: Abdomen is flat. Bowel sounds are normal.     Palpations: Abdomen is soft.  Musculoskeletal:        General: Normal range of motion.     Cervical back: Normal range of motion and neck supple.  Skin:    General: Skin is warm and dry.     Capillary Refill: Capillary refill takes less than 2 seconds.  Neurological:     General: No focal deficit present.     Mental Status: He is alert and oriented to person, place, and time.  Psychiatric:        Mood and Affect: Mood normal.        Behavior: Behavior normal.        Thought Content: Thought content normal.        Judgment: Judgment normal.         Assessment And Plan:     1. Insomnia, unspecified type  Chronic, doing well with ambien - zolpidem (AMBIEN) 10 MG tablet; Take 1 tablet (10 mg total) by mouth at bedtime as  needed for sleep.  Dispense: 30 tablet; Refill: 5  2. Type 2 diabetes mellitus without complication, without long-term current use of insulin (HCC)  Chronic, slightly elevated at last visti  He would like to see if he can get the Janumet again, we may need to make changes as well pending his lab results  Diabetic foot exam done with thickened nail beds, normal sensation - Hemoglobin A1c  3. Vitamin D deficiency  Will check vitamin D level and supplement as needed.     Also encouraged to spend 15 minutes in the  sun daily.  - VITAMIN D 25 Hydroxy (Vit-D Deficiency, Fractures) - Vitamin D, Ergocalciferol, (DRISDOL) 1.25 MG (50000 UNIT) CAPS capsule; Take 1 capsule (50,000 Units total) by mouth 2 (two) times a week.  Dispense: 24 capsule; Refill: 1  4. Essential hypertension  Chronic, fair control  Continue with current medications - CMP14+EGFR  5. Erectile dysfunction, unspecified erectile dysfunction type - sildenafil (VIAGRA) 100 MG tablet; Take 1 tablet (100 mg total) by mouth as needed for erectile dysfunction.  Dispense: 20 tablet; Refill: 1  6. Encounter for screening colonoscopy  According to USPTF Colorectal cancer Screening guidelines. Colonoscopy is recommended every 10 years, starting at age 67years.  Will refer to GI for colon cancer screening. - Ambulatory referral to Gastroenterology  7. Onychomycosis  During his foot exam he has thickened toenails and yellow discoloration.   Will start him on lamisil, pending liver functions test - terbinafine (LAMISIL) 250 MG tablet; Take 1 tablet (250 mg total) by mouth daily.  Dispense: 90 tablet; Refill: 0     Patient was given opportunity to ask questions. Patient verbalized understanding of the plan and was able to repeat key elements of the plan. All questions were answered to their satisfaction.  Minette Brine, FNP   I, Minette Brine, FNP, have reviewed all documentation for this visit. The documentation on 04/06/20  for the exam, diagnosis, procedures, and orders are all accurate and complete.   IF YOU HAVE BEEN REFERRED TO A SPECIALIST, IT MAY TAKE 1-2 WEEKS TO SCHEDULE/PROCESS THE REFERRAL. IF YOU HAVE NOT HEARD FROM US/SPECIALIST IN TWO WEEKS, PLEASE GIVE [place] A CALL AT [phone].   THE PATIENT IS ENCOURAGED TO PRACTICE SOCIAL DISTANCING DUE TO THE COVID-19 PANDEMIC.

## 2020-04-06 NOTE — Patient Instructions (Signed)

## 2020-04-07 ENCOUNTER — Other Ambulatory Visit: Payer: Self-pay | Admitting: Nurse Practitioner

## 2020-04-07 DIAGNOSIS — E1159 Type 2 diabetes mellitus with other circulatory complications: Secondary | ICD-10-CM

## 2020-04-07 LAB — CMP14+EGFR
ALT: 17 IU/L (ref 0–44)
AST: 12 IU/L (ref 0–40)
Albumin/Globulin Ratio: 1.9 (ref 1.2–2.2)
Albumin: 4.3 g/dL (ref 3.8–4.9)
Alkaline Phosphatase: 97 IU/L (ref 44–121)
BUN/Creatinine Ratio: 10 (ref 9–20)
BUN: 10 mg/dL (ref 6–24)
Bilirubin Total: 0.2 mg/dL (ref 0.0–1.2)
CO2: 22 mmol/L (ref 20–29)
Calcium: 9.2 mg/dL (ref 8.7–10.2)
Chloride: 103 mmol/L (ref 96–106)
Creatinine, Ser: 0.97 mg/dL (ref 0.76–1.27)
Globulin, Total: 2.3 g/dL (ref 1.5–4.5)
Glucose: 238 mg/dL — ABNORMAL HIGH (ref 65–99)
Potassium: 4.3 mmol/L (ref 3.5–5.2)
Sodium: 140 mmol/L (ref 134–144)
Total Protein: 6.6 g/dL (ref 6.0–8.5)
eGFR: 91 mL/min/{1.73_m2} (ref >59)

## 2020-04-07 LAB — VITAMIN D 25 HYDROXY (VIT D DEFICIENCY, FRACTURES): Vit D, 25-Hydroxy: 26 ng/mL — ABNORMAL LOW (ref 30.0–100.0)

## 2020-04-07 LAB — HEMOGLOBIN A1C
Est. average glucose Bld gHb Est-mCnc: 286 mg/dL
Hgb A1c MFr Bld: 11.6 % — ABNORMAL HIGH (ref 4.8–5.6)

## 2020-04-07 MED ORDER — RYBELSUS 7 MG PO TABS: 1.0000 | ORAL_TABLET | Freq: Every day | ORAL | 0 refills | Status: DC

## 2020-04-29 ENCOUNTER — Other Ambulatory Visit: Payer: Self-pay | Admitting: Nurse Practitioner

## 2020-04-30 DIAGNOSIS — I129 Hypertensive chronic kidney disease with stage 1 through stage 4 chronic kidney disease, or unspecified chronic kidney disease: Secondary | ICD-10-CM | POA: Diagnosis not present

## 2020-04-30 DIAGNOSIS — R809 Proteinuria, unspecified: Secondary | ICD-10-CM | POA: Diagnosis not present

## 2020-04-30 DIAGNOSIS — E1122 Type 2 diabetes mellitus with diabetic chronic kidney disease: Secondary | ICD-10-CM | POA: Diagnosis not present

## 2020-04-30 DIAGNOSIS — N281 Cyst of kidney, acquired: Secondary | ICD-10-CM | POA: Diagnosis not present

## 2020-05-04 ENCOUNTER — Other Ambulatory Visit: Payer: Self-pay | Admitting: Internal Medicine

## 2020-05-04 DIAGNOSIS — N281 Cyst of kidney, acquired: Secondary | ICD-10-CM

## 2020-05-14 ENCOUNTER — Ambulatory Visit
Admission: RE | Admit: 2020-05-14 | Discharge: 2020-05-14 | Disposition: A | Discharge status: Home / Self Care | Payer: BC Managed Care – PPO | Source: Ambulatory Visit | Attending: Internal Medicine | Admitting: Internal Medicine

## 2020-05-14 DIAGNOSIS — N281 Cyst of kidney, acquired: Secondary | ICD-10-CM | POA: Diagnosis not present

## 2020-05-26 ENCOUNTER — Other Ambulatory Visit: Payer: Self-pay | Admitting: Nurse Practitioner

## 2020-05-26 DIAGNOSIS — R7989 Other specified abnormal findings of blood chemistry: Secondary | ICD-10-CM

## 2020-05-26 DIAGNOSIS — N529 Male erectile dysfunction, unspecified: Secondary | ICD-10-CM

## 2020-05-26 NOTE — Telephone Encounter (Signed)
Testosterone refill

## 2020-06-02 ENCOUNTER — Telehealth: Payer: Self-pay

## 2020-06-02 NOTE — Telephone Encounter (Signed)
Pa request YL,RMA

## 2020-06-03 ENCOUNTER — Ambulatory Visit: Payer: Self-pay | Admitting: Nurse Practitioner

## 2020-06-07 ENCOUNTER — Other Ambulatory Visit: Payer: Self-pay

## 2020-06-07 DIAGNOSIS — R7989 Other specified abnormal findings of blood chemistry: Secondary | ICD-10-CM

## 2020-06-07 DIAGNOSIS — N529 Male erectile dysfunction, unspecified: Secondary | ICD-10-CM

## 2020-06-07 NOTE — Progress Notes (Signed)
Patient notified that the PA for his testosterone was denied so Ms.Moore referred to urology. YL,RMA

## 2020-06-20 ENCOUNTER — Other Ambulatory Visit: Payer: Self-pay | Admitting: Nurse Practitioner

## 2020-06-24 ENCOUNTER — Ambulatory Visit: Payer: Self-pay | Admitting: Nurse Practitioner

## 2020-07-12 ENCOUNTER — Ambulatory Visit: Payer: Self-pay | Admitting: Nurse Practitioner

## 2020-07-27 ENCOUNTER — Encounter: Payer: Self-pay | Admitting: Nurse Practitioner

## 2020-07-27 ENCOUNTER — Ambulatory Visit: Payer: BC Managed Care – PPO | Admitting: Nurse Practitioner

## 2020-07-27 ENCOUNTER — Other Ambulatory Visit: Payer: Self-pay

## 2020-07-27 VITALS — BP 116/80 | HR 84 | Temp 98.2°F | Ht 67.8 in | Wt 230.6 lb

## 2020-07-27 DIAGNOSIS — E6609 Other obesity due to excess calories: Secondary | ICD-10-CM

## 2020-07-27 DIAGNOSIS — E1159 Type 2 diabetes mellitus with other circulatory complications: Secondary | ICD-10-CM

## 2020-07-27 DIAGNOSIS — G4700 Insomnia, unspecified: Secondary | ICD-10-CM | POA: Diagnosis not present

## 2020-07-27 DIAGNOSIS — N521 Erectile dysfunction due to diseases classified elsewhere: Secondary | ICD-10-CM | POA: Diagnosis not present

## 2020-07-27 DIAGNOSIS — E559 Vitamin D deficiency, unspecified: Secondary | ICD-10-CM | POA: Diagnosis not present

## 2020-07-27 DIAGNOSIS — Z6835 Body mass index (BMI) 35.0-35.9, adult: Secondary | ICD-10-CM

## 2020-07-27 MED ORDER — JANUMET 50-1000 MG PO TABS: ORAL_TABLET | ORAL | 1 refills | Status: DC

## 2020-07-27 NOTE — Progress Notes (Signed)
I,Yamilka [employer] as a Education administrator for [employer], FNP.,have documented all relevant documentation on the behalf of Minette Brine, FNP,as directed by  Minette Brine, FNP while in the presence of Minette Brine, [place].  This visit occurred during the SARS-CoV-2 public health emergency.  Safety protocols were in place, including screening questions prior to the visit, additional usage of staff PPE, and extensive cleaning of exam room while observing appropriate contact time as indicated for disinfecting solutions.  Subjective:     Patient ID: David Humphrey , male    DOB: [birth year] , 60 y.o.   MRN: [id number]   Chief Complaint  Patient presents with   Diabetes   Insomnia    HPI  Patient presents today for a dm and insomnia f/u. Exercising 4 times a week at [employer] (15-20 minutes after each workout).  For the last 3 weeks.  Prior to his last visit he had been drinking more sodas, teas, milk and more sweets.   Wt Readings from Last 3 Encounters: 07/27/20 : 230 lb 9.6 oz (104.6 kg) 04/06/20 : 221 lb 12.8 oz (100.6 kg) 01/07/20 : 231 lb (104.8 kg)    Diabetes He presents for his follow-up diabetic visit. He has type 2 diabetes mellitus. There are no hypoglycemic associated symptoms. Pertinent negatives for diabetes include no blurred vision, no polydipsia, no polyphagia and no polyuria. There are no hypoglycemic complications. There are no diabetic complications. There are no known risk factors for coronary artery disease. When asked about current treatments, none were reported. He is compliant with treatment all of the time. He is following a generally healthy diet. He has not had a previous visit with a dietitian. (Blood sugars average 100-150) He does not see a podiatrist.Eye exam is not current (he had a call from an opthalmologist for his eye exam).  Insomnia Primary symptoms: sleep disturbance, frequent awakening.   PMH includes: no hypertension.     Past Medical History:   Diagnosis Date   Diabetes mellitus without complication ([place])    Hypertension      Family History  Problem Relation Age of Onset   Hypertension Mother    Kidney disease Mother    Cancer Father    Cancer Paternal Grandmother      Current Outpatient Medications:    atorvastatin (LIPITOR) 10 MG tablet, Take 1 tablet by mouth MWF, Disp: 36 tablet, Rfl: 1   Blood Glucose Monitoring Suppl (ONETOUCH VERIO) w/Device KIT, 1 Device by Does not apply route daily. Use as directed to check blood sugars 1 time daily, Disp: 1 kit, Rfl: 0   glucose blood (ONETOUCH VERIO) test strip, Use as instructed to check blood sugars 1 time daily, Disp: 150 each, Rfl: 2   ibuprofen (ADVIL,MOTRIN) 600 MG tablet, Take 1 tablet (600 mg total) by mouth every 6 (six) hours as needed., Disp: 30 tablet, Rfl: 0   lisinopril (ZESTRIL) 10 MG tablet, TAKE 1 TABLET BY MOUTH EVERY DAY, Disp: 90 tablet, Rfl: 1   NEEDLE, DISP, 25 G (EASY TOUCH FLIPLOCK NEEDLES) 25G X 1" MISC, Use with testosterone 2 times a week, Disp: 50 each, Rfl: 3   sildenafil (VIAGRA) 100 MG tablet, Take 1 tablet (100 mg total) by mouth as needed for erectile dysfunction., Disp: 20 tablet, Rfl: 1   testosterone cypionate (DEPOTESTOTERONE CYPIONATE) 100 MG/ML injection, INJECT 1 ML (100 MG TOTAL) INTO THE MUSCLE 2 (TWO) TIMES A WEEK. FOR IM USE ONLY, Disp: 10 mL, Rfl: 3   Vitamin D,  Ergocalciferol, (DRISDOL) 1.25 MG (50000 UNIT) CAPS capsule, Take 1 capsule (50,000 Units total) by mouth 2 (two) times a week., Disp: 24 capsule, Rfl: 1   zolpidem (AMBIEN) 10 MG tablet, Take 1 tablet (10 mg total) by mouth at bedtime as needed for sleep., Disp: 30 tablet, Rfl: 5   sitaGLIPtin-metformin (JANUMET) 50-1000 MG tablet, TAKE 1 TABLET BY MOUTH TWICE A DAY WITH A MEAL, Disp: 180 tablet, Rfl: 1   No Known Allergies   Review of Systems  Constitutional: Negative.   HENT: Negative.    Eyes:  Negative for blurred vision.  Respiratory: Negative.    Cardiovascular:  Negative.   Gastrointestinal: Negative.   Endocrine: Negative.  Negative for polydipsia, polyphagia and polyuria.  Genitourinary: Negative.   Musculoskeletal: Negative.   Skin: Negative.   Neurological: Negative.   Hematological: Negative.   Psychiatric/Behavioral:  Positive for sleep disturbance. The patient has insomnia.     Today's Vitals   07/27/20 1551  BP: 116/80  Pulse: 84  Temp: 98.2 F (36.8 C)  Weight: 230 lb 9.6 oz (104.6 kg)  Height: 5' 7.8" (1.722 m)  PainSc: 0-No pain   Body mass index is 35.27 kg/m.   Objective:  Physical Exam Vitals reviewed.  Constitutional:      Appearance: Normal appearance. He is obese.  Cardiovascular:     Rate and Rhythm: Normal rate and regular rhythm.     Pulses: Normal pulses.     Heart sounds: Normal heart sounds. No murmur heard. Pulmonary:     Effort: No respiratory distress.     Breath sounds: Normal breath sounds.  Musculoskeletal:        General: No swelling.  Skin:    General: Skin is warm and dry.     Capillary Refill: Capillary refill takes less than 2 seconds.  Neurological:     General: No focal deficit present.     Mental Status: He is alert and oriented to person, place, and time.     Cranial Nerves: No cranial nerve deficit.     Motor: No weakness.  Psychiatric:        Mood and Affect: Mood normal.        Behavior: Behavior normal.        Thought Content: Thought content normal.        Judgment: Judgment normal.        Assessment And Plan:     1. Type 2 diabetes mellitus with other circulatory complication, without long-term current use of insulin (HCC) Chronic, not well controlled, His HgbA1c was 11.4 Continue with current medications pending lab results Encouraged to limit intake of sugary foods and drinks Encouraged to increase physical activity to 150 minutes per week He is to go for his eye exam - Hemoglobin A1c - sitaGLIPtin-metformin (JANUMET) 50-1000 MG tablet; TAKE 1 TABLET BY MOUTH TWICE A  DAY WITH A MEAL  Dispense: 180 tablet; Refill: 1 - BMP8+eGFR  2. Insomnia, unspecified type Chronic, stable Continue current medications  3. Class 2 obesity due to excess calories with body mass index (BMI) of 35.0 to 35.9 in adult, unspecified whether serious comorbidity present Chronic Discussed healthy diet and regular exercise options  Encouraged to exercise at least 150 minutes per week with 2 days of strength training  4. Vitamin D deficiency Will check vitamin D level and supplement as needed.    Also encouraged to spend 15 minutes in the sun daily.  - VITAMIN D 25 Hydroxy (Vit-D Deficiency, Fractures)  5.  Erectile dysfunction due to diseases classified elsewhere Will check his testosterone levels, I will also refer to Urology for further evaluation - Testosterone, Total    Patient was given opportunity to ask questions. Patient verbalized understanding of the plan and was able to repeat key elements of the plan. All questions were answered to their satisfaction.  Minette Brine, FNP   I, Minette Brine, FNP, have reviewed all documentation for this visit. The documentation on 08/04/20 for the exam, diagnosis, procedures, and orders are all accurate and complete.   IF YOU HAVE BEEN REFERRED TO A SPECIALIST, IT MAY TAKE 1-2 WEEKS TO SCHEDULE/PROCESS THE REFERRAL. IF YOU HAVE NOT HEARD FROM US/SPECIALIST IN TWO WEEKS, PLEASE GIVE [place] A CALL AT [phone].   THE PATIENT IS ENCOURAGED TO PRACTICE SOCIAL DISTANCING DUE TO THE COVID-19 PANDEMIC.

## 2020-07-27 NOTE — Patient Instructions (Addendum)
Exercising to Lose Weight Exercise is structured, repetitive physical activity to improve fitness and health. Getting regular exercise is important for everyone. It is especially important if you are overweight. Being overweight increases your risk of heart disease, stroke, diabetes, high blood pressure, and several types of cancer.Reducing your calorie intake and exercising can help you lose weight. Exercise is usually categorized as moderate or vigorous intensity. To lose weight, most people need to do a certain amount of moderate-intensity orvigorous-intensity exercise each week. Moderate-intensity exercise  Moderate-intensity exercise is any activity that gets you moving enough to burn at least three times more energy (calories) than if you were sitting. Examples of moderate exercise include: Walking a mile in 15 minutes. Doing light yard work. Biking at an easy pace. Most people should get at least 150 minutes (2 hours and 30 minutes) a week ofmoderate-intensity exercise to maintain their body weight. Vigorous-intensity exercise Vigorous-intensity exercise is any activity that gets you moving enough to burn at least six times more calories than if you were sitting. When you exercise at this intensity, you should be working hard enough that you are not able tocarry on a conversation. Examples of vigorous exercise include: Running. Playing a team sport, such as football, basketball, and soccer. Jumping rope. Most people should get at least 75 minutes (1 hour and 15 minutes) a week ofvigorous-intensity exercise to maintain their body weight. How can exercise affect me? When you exercise enough to burn more calories than you eat, you lose weight. Exercise also reduces body fat and builds muscle. The more muscle you have, the more calories you burn. Exercise also: Improves mood. Reduces stress and tension. Improves your overall fitness, flexibility, and endurance. Increases bone strength. The  amount of exercise you need to lose weight depends on: Your age. The type of exercise. Any health conditions you have. Your overall physical ability. Talk to your health care provider about how much exercise you need and whattypes of activities are safe for you. What actions can I take to lose weight? Nutrition  Make changes to your diet as told by your health care provider or diet and nutrition specialist (dietitian). This may include: Eating fewer calories. Eating more protein. Eating less unhealthy fats. Eating a diet that includes fresh fruits and vegetables, whole grains, low-fat dairy products, and lean protein. Avoiding foods with added fat, salt, and sugar. Drink plenty of water while you exercise to prevent dehydration or heat stroke.  Activity Choose an activity that you enjoy and set realistic goals. Your health care provider can help you make an exercise plan that works for you. Exercise at a moderate or vigorous intensity most days of the week. The intensity of exercise may vary from person to person. You can tell how intense a workout is for you by paying attention to your breathing and heartbeat. Most people will notice their breathing and heartbeat get faster with more intense exercise. Do resistance training twice each week, such as: Push-ups. Sit-ups. Lifting weights. Using resistance bands. Getting short amounts of exercise can be just as helpful as long structured periods of exercise. If you have trouble finding time to exercise, try to include exercise in your daily routine. Get up, stretch, and walk around every 30 minutes throughout the day. Go for a walk during your lunch break. Park your car farther away from your destination. If you take public transportation, get off one stop early and walk the rest of the way. Make phone calls while standing up and   walking around. Take the stairs instead of elevators or escalators. Wear comfortable clothes and shoes with  good support. Do not exercise so much that you hurt yourself, feel dizzy, or get very short of breath. Where to find more information U.[place]. Department of Health and Human Services: ThisPath.fi Centers for Disease Control and Prevention (CDC): [employer].br Contact a health care provider: Before starting a new exercise program. If you have questions or concerns about your weight. If you have a medical problem that keeps you from exercising. Get help right away if you have any of the following while exercising: Injury. Dizziness. Difficulty breathing or shortness of breath that does not go away when you stop exercising. Chest pain. Rapid heartbeat. Summary Being overweight increases your risk of heart disease, stroke, diabetes, high blood pressure, and several types of cancer. Losing weight happens when you burn more calories than you eat. Reducing the amount of calories you eat in addition to getting regular moderate or vigorous exercise each week helps you lose weight. This information is not intended to replace advice given to you by your health care provider. Make sure you discuss any questions you have with your healthcare provider. Document Revised: 04/21/2019 Document Reviewed: 05/08/2019 Elsevier Patient Education  2022 [employer].  Take benefiber supplement (over the counter) daily this may help your HgbA1c to improve

## 2020-07-28 LAB — BMP8+EGFR
BUN/Creatinine Ratio: 11 (ref 9–20)
BUN: 11 mg/dL (ref 6–24)
CO2: 26 mmol/L (ref 20–29)
Calcium: 9.2 mg/dL (ref 8.7–10.2)
Chloride: 100 mmol/L (ref 96–106)
Creatinine, Ser: 1 mg/dL (ref 0.76–1.27)
Glucose: 182 mg/dL — ABNORMAL HIGH (ref 65–99)
Potassium: 4.4 mmol/L (ref 3.5–5.2)
Sodium: 139 mmol/L (ref 134–144)
eGFR: 88 mL/min/{1.73_m2} (ref >59)

## 2020-07-28 LAB — HEMOGLOBIN A1C
Est. average glucose Bld gHb Est-mCnc: 206 mg/dL
Hgb A1c MFr Bld: 8.8 % — ABNORMAL HIGH (ref 4.8–5.6)

## 2020-07-28 LAB — VITAMIN D 25 HYDROXY (VIT D DEFICIENCY, FRACTURES): Vit D, 25-Hydroxy: 39.1 ng/mL (ref 30.0–100.0)

## 2020-07-28 LAB — TESTOSTERONE: Testosterone: 428 ng/dL (ref 264–916)

## 2020-08-04 ENCOUNTER — Encounter: Payer: Self-pay | Admitting: Nurse Practitioner

## 2020-08-18 ENCOUNTER — Encounter: Payer: 59 | Admitting: Nurse Practitioner

## 2020-08-18 NOTE — Progress Notes (Deleted)
I,Kenedi Cilia [employer] as a Education administrator for [employer], FNP.,have documented all relevant documentation on the behalf of Minette Brine, FNP,as directed by  Minette Brine, FNP while in the presence of Minette Brine, [place].  This visit occurred during the SARS-CoV-2 public health emergency.  Safety protocols were in place, including screening questions prior to the visit, additional usage of staff PPE, and extensive cleaning of exam room while observing appropriate contact time as indicated for disinfecting solutions.  Subjective:     Patient ID: David Humphrey , male    DOB: 1960/06/01 , 60 y.o.   MRN: [id number]   No chief complaint on file.   HPI  Patient here for hm    Past Medical History:  Diagnosis Date   Diabetes mellitus without complication ([place])    Hypertension      Family History  Problem Relation Age of Onset   Hypertension Mother    Kidney disease Mother    Cancer Father    Cancer Paternal Grandmother      Current Outpatient Medications:    atorvastatin (LIPITOR) 10 MG tablet, Take 1 tablet by mouth MWF, Disp: 36 tablet, Rfl: 1   Blood Glucose Monitoring Suppl (ONETOUCH VERIO) w/Device KIT, 1 Device by Does not apply route daily. Use as directed to check blood sugars 1 time daily, Disp: 1 kit, Rfl: 0   glucose blood (ONETOUCH VERIO) test strip, Use as instructed to check blood sugars 1 time daily, Disp: 150 each, Rfl: 2   ibuprofen (ADVIL,MOTRIN) 600 MG tablet, Take 1 tablet (600 mg total) by mouth every 6 (six) hours as needed., Disp: 30 tablet, Rfl: 0   lisinopril (ZESTRIL) 10 MG tablet, TAKE 1 TABLET BY MOUTH EVERY DAY, Disp: 90 tablet, Rfl: 1   NEEDLE, DISP, 25 G (EASY TOUCH FLIPLOCK NEEDLES) 25G X 1" MISC, Use with testosterone 2 times a week, Disp: 50 each, Rfl: 3   sildenafil (VIAGRA) 100 MG tablet, Take 1 tablet (100 mg total) by mouth as needed for erectile dysfunction., Disp: 20 tablet, Rfl: 1   sitaGLIPtin-metformin (JANUMET) 50-1000 MG tablet, TAKE 1  TABLET BY MOUTH TWICE A DAY WITH A MEAL, Disp: 180 tablet, Rfl: 1   testosterone cypionate (DEPOTESTOTERONE CYPIONATE) 100 MG/ML injection, INJECT 1 ML (100 MG TOTAL) INTO THE MUSCLE 2 (TWO) TIMES A WEEK. FOR IM USE ONLY, Disp: 10 mL, Rfl: 3   Vitamin D, Ergocalciferol, (DRISDOL) 1.25 MG (50000 UNIT) CAPS capsule, Take 1 capsule (50,000 Units total) by mouth 2 (two) times a week., Disp: 24 capsule, Rfl: 1   zolpidem (AMBIEN) 10 MG tablet, Take 1 tablet (10 mg total) by mouth at bedtime as needed for sleep., Disp: 30 tablet, Rfl: 5   No Known Allergies   Men's preventive visit. Patient Health Questionnaire (PHQ-2) is  [place] [facility] Visit from 04/06/2020 in [facility]  PHQ-2 Total Score 0     . Patient is on a *** diet. Marital status: Single. Relevant history for alcohol use is:  Social History   Substance and Sexual Activity  Alcohol Use Yes   Comment: socially  . Relevant history for tobacco use is:  Social History   Tobacco Use  Smoking Status Never  Smokeless Tobacco Never  .   Review of Systems  Constitutional: Negative.   HENT: Negative.    Eyes: Negative.   Respiratory: Negative.    Cardiovascular: Negative.   Gastrointestinal: Negative.   Endocrine: Negative.   Genitourinary: Negative.   Musculoskeletal: Negative.  Skin: Negative.   Neurological: Negative.   Hematological: Negative.   Psychiatric/Behavioral: Negative.      There were no vitals filed for this visit. There is no height or weight on file to calculate BMI.   Objective:  Physical Exam      Assessment And Plan:    1. Encounter for general adult medical examination w/o abnormal findings  2. Insomnia, unspecified type  3. Type 2 diabetes mellitus with other circulatory complication, without long-term current use of insulin ([place])  4. Vitamin D deficiency  5. Essential hypertension     Patient was given opportunity to ask questions. Patient verbalized  understanding of the plan and was able to repeat key elements of the plan. All questions were answered to their satisfaction.   [id number] [place], CMA   I, [place], [place], have reviewed all documentation for this visit. The documentation on 08/18/20 for the exam, diagnosis, procedures, and orders are all accurate and complete.  THE PATIENT IS ENCOURAGED TO PRACTICE SOCIAL DISTANCING DUE TO THE COVID-19 PANDEMIC.

## 2020-08-18 NOTE — Patient Instructions (Signed)
Health Maintenance, Male Adopting a healthy lifestyle and getting preventive care are important in promoting health and wellness. Ask your health care provider about: The right schedule for you to have regular tests and exams. Things you can do on your own to prevent diseases and keep yourself healthy. What should I know about diet, weight, and exercise? Eat a healthy diet  Eat a diet that includes plenty of vegetables, fruits, low-fat dairy products, and lean protein. Do not eat a lot of foods that are high in solid fats, added sugars, or sodium.  Maintain a healthy weight Body mass index (BMI) is a measurement that can be used to identify possible weight problems. It estimates body fat based on height and weight. Your health care provider can help determine your BMI and help you achieve or maintain ahealthy weight. Get regular exercise Get regular exercise. This is one of the most important things you can do for your health. Most adults should: Exercise for at least 150 minutes each week. The exercise should increase your heart rate and make you sweat (moderate-intensity exercise). Do strengthening exercises at least twice a week. This is in addition to the moderate-intensity exercise. Spend less time sitting. Even light physical activity can be beneficial. Watch cholesterol and blood lipids Have your blood tested for lipids and cholesterol at 58 years of age, then havethis test every 5 years. You may need to have your cholesterol levels checked more often if: Your lipid or cholesterol levels are high. You are older than 58 years of age. You are at high risk for heart disease. What should I know about cancer screening? Many types of cancers can be detected early and may often be prevented. Depending on your health history and family history, you may need to have cancer screening at various ages. This may include screening for: Colorectal cancer. Prostate cancer. Skin cancer. Lung  cancer. What should I know about heart disease, diabetes, and high blood pressure? Blood pressure and heart disease High blood pressure causes heart disease and increases the risk of stroke. This is more likely to develop in people who have high blood pressure readings, are of African descent, or are overweight. Talk with your health care provider about your target blood pressure readings. Have your blood pressure checked: Every 3-5 years if you are 18-39 years of age. Every year if you are 40 years old or older. If you are between the ages of 65 and 75 and are a current or former smoker, ask your health care provider if you should have a one-time screening for abdominal aortic aneurysm (AAA). Diabetes Have regular diabetes screenings. This checks your fasting blood sugar level. Have the screening done: Once every three years after age 45 if you are at a normal weight and have a low risk for diabetes. More often and at a younger age if you are overweight or have a high risk for diabetes. What should I know about preventing infection? Hepatitis B If you have a higher risk for hepatitis B, you should be screened for this virus. Talk with your health care provider to find out if you are at risk forhepatitis B infection. Hepatitis C Blood testing is recommended for: Everyone born from 1945 through 1965. Anyone with known risk factors for hepatitis C. Sexually transmitted infections (STIs) You should be screened each year for STIs, including gonorrhea and chlamydia, if: You are sexually active and are younger than 58 years of age. You are older than 58 years of age   and your health care provider tells you that you are at risk for this type of infection. Your sexual activity has changed since you were last screened, and you are at increased risk for chlamydia or gonorrhea. Ask your health care provider if you are at risk. Ask your health care provider about whether you are at high risk for HIV.  Your health care provider may recommend a prescription medicine to help prevent HIV infection. If you choose to take medicine to prevent HIV, you should first get tested for HIV. You should then be tested every 3 months for as long as you are taking the medicine. Follow these instructions at home: Lifestyle Do not use any products that contain nicotine or tobacco, such as cigarettes, e-cigarettes, and chewing tobacco. If you need help quitting, ask your health care provider. Do not use street drugs. Do not share needles. Ask your health care provider for help if you need support or information about quitting drugs. Alcohol use Do not drink alcohol if your health care provider tells you not to drink. If you drink alcohol: Limit how much you have to 0-2 drinks a day. Be aware of how much alcohol is in your drink. In the [place] one drink equals one 12 oz bottle of beer (355 mL), one 5 oz glass of wine (148 mL), or one 1 oz glass of hard liquor (44 mL). General instructions Schedule regular health, dental, and eye exams. Stay current with your vaccines. Tell your health care provider if: You often feel depressed. You have ever been abused or do not feel safe at home. Summary Adopting a healthy lifestyle and getting preventive care are important in promoting health and wellness. Follow your health care provider's instructions about healthy diet, exercising, and getting tested or screened for diseases. Follow your health care provider's instructions on monitoring your cholesterol and blood pressure. This information is not intended to replace advice given to you by your health care provider. Make sure you discuss any questions you have with your healthcare provider. Document Revised: 01/02/2018 Document Reviewed: 01/02/2018 Elsevier Patient Education  2022 Elsevier Inc.  

## 2020-08-24 ENCOUNTER — Telehealth: Payer: Self-pay

## 2020-08-24 NOTE — Telephone Encounter (Signed)
Called pt to notify. [facility] has been trying to get in touch, pt reminded to give them a call to make an apt. Pt stated he would make an apt soon.

## 2020-09-17 ENCOUNTER — Other Ambulatory Visit: Payer: Self-pay | Admitting: Nurse Practitioner

## 2020-10-14 ENCOUNTER — Other Ambulatory Visit: Payer: Self-pay | Admitting: Nurse Practitioner

## 2020-10-14 DIAGNOSIS — G4700 Insomnia, unspecified: Secondary | ICD-10-CM

## 2020-10-28 ENCOUNTER — Ambulatory Visit: Payer: BC Managed Care – PPO | Admitting: Nurse Practitioner

## 2020-11-18 ENCOUNTER — Other Ambulatory Visit: Payer: Self-pay | Admitting: Nurse Practitioner

## 2020-12-10 ENCOUNTER — Other Ambulatory Visit: Payer: Self-pay | Admitting: Nurse Practitioner

## 2020-12-10 DIAGNOSIS — N529 Male erectile dysfunction, unspecified: Secondary | ICD-10-CM

## 2020-12-10 DIAGNOSIS — R7989 Other specified abnormal findings of blood chemistry: Secondary | ICD-10-CM

## 2021-03-14 ENCOUNTER — Other Ambulatory Visit: Payer: Self-pay | Admitting: Nurse Practitioner

## 2021-03-14 DIAGNOSIS — N529 Male erectile dysfunction, unspecified: Secondary | ICD-10-CM

## 2021-03-15 ENCOUNTER — Telehealth: Payer: Self-pay

## 2021-03-15 NOTE — Telephone Encounter (Signed)
The patient was contacted to see if he is still a patient at the practice.  The patient said yes, he scheduled an appt for a follow-up on March 23rd.

## 2021-04-06 DIAGNOSIS — E291 Testicular hypofunction: Secondary | ICD-10-CM | POA: Diagnosis not present

## 2021-04-06 DIAGNOSIS — N5201 Erectile dysfunction due to arterial insufficiency: Secondary | ICD-10-CM | POA: Diagnosis not present

## 2021-04-06 DIAGNOSIS — Z125 Encounter for screening for malignant neoplasm of prostate: Secondary | ICD-10-CM | POA: Diagnosis not present

## 2021-04-14 ENCOUNTER — Ambulatory Visit: Payer: BC Managed Care – PPO | Admitting: Nurse Practitioner

## 2021-04-14 ENCOUNTER — Other Ambulatory Visit: Payer: Self-pay

## 2021-04-14 ENCOUNTER — Encounter: Payer: Self-pay | Admitting: Nurse Practitioner

## 2021-04-14 ENCOUNTER — Other Ambulatory Visit (HOSPITAL_COMMUNITY)
Admission: RE | Admit: 2021-04-14 | Discharge: 2021-04-14 | Disposition: A | Discharge status: Home / Self Care | Payer: BC Managed Care – PPO | Source: Ambulatory Visit | Attending: Nurse Practitioner | Admitting: Nurse Practitioner

## 2021-04-14 VITALS — BP 130/70 | HR 83 | Temp 98.7°F | Ht 67.8 in | Wt 219.0 lb

## 2021-04-14 DIAGNOSIS — Z113 Encounter for screening for infections with a predominantly sexual mode of transmission: Secondary | ICD-10-CM | POA: Insufficient documentation

## 2021-04-14 DIAGNOSIS — Z1211 Encounter for screening for malignant neoplasm of colon: Secondary | ICD-10-CM

## 2021-04-14 DIAGNOSIS — Z114 Encounter for screening for human immunodeficiency virus [HIV]: Secondary | ICD-10-CM

## 2021-04-14 DIAGNOSIS — E1159 Type 2 diabetes mellitus with other circulatory complications: Secondary | ICD-10-CM | POA: Diagnosis not present

## 2021-04-14 DIAGNOSIS — E6609 Other obesity due to excess calories: Secondary | ICD-10-CM

## 2021-04-14 DIAGNOSIS — G4700 Insomnia, unspecified: Secondary | ICD-10-CM

## 2021-04-14 DIAGNOSIS — I10 Essential (primary) hypertension: Secondary | ICD-10-CM | POA: Diagnosis not present

## 2021-04-14 DIAGNOSIS — N521 Erectile dysfunction due to diseases classified elsewhere: Secondary | ICD-10-CM

## 2021-04-14 DIAGNOSIS — Z6833 Body mass index (BMI) 33.0-33.9, adult: Secondary | ICD-10-CM

## 2021-04-14 DIAGNOSIS — I119 Hypertensive heart disease without heart failure: Secondary | ICD-10-CM

## 2021-04-14 MED ORDER — JANUMET 50-1000 MG PO TABS: ORAL_TABLET | ORAL | 1 refills | Status: DC

## 2021-04-14 NOTE — Progress Notes (Signed)
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 ? ?This visit occurred during the SARS-CoV-2 public health emergency.  Safety protocols were in place, including screening questions prior to the visit, additional usage of staff PPE, and extensive cleaning of exam room while observing appropriate contact time as indicated for disinfecting solutions. ? ?Subjective:  ?  ? Patient ID: David Humphrey , male    DOB: 1960-11-30 , 61 y.o.   MRN: [id number] ? ? ?Chief Complaint  ?Patient presents with  ? Diabetes  ? ? ?HPI ? ?Patient presents today for a dm and insomnia f/u. Exercising 6 times a week at [employer] (15-20 minutes after each workout).  He has been drinking more juice than usual since his sister and niece has been staying with him. The last month and a half took once a day.  He has a new sexual partner and would like to be tested for STDs ? ?He had testosterone injection today ? ?Diabetes ?He presents for his follow-up diabetic visit. He has type 2 diabetes mellitus. There are no hypoglycemic associated symptoms. Pertinent negatives for diabetes include no blurred vision, no polydipsia, no polyphagia and no polyuria. There are no hypoglycemic complications. There are no diabetic complications. There are no known risk factors for coronary artery disease. When asked about current treatments, none were reported. He is compliant with treatment all of the time. He is following a generally healthy diet. He has not had a previous visit with a dietitian. (Blood sugars average 100-150) He does not see a podiatrist.Eye exam is not current (he had a call from an opthalmologist for his eye exam).  ?Insomnia ?Primary symptoms: sleep disturbance, frequent awakening.   ?PMH includes: no hypertension.    ? ?Past Medical History:  ?Diagnosis Date  ? Diabetes  mellitus without complication ([place])   ? Hypertension   ?  ? ?Family History  ?Problem Relation Age of Onset  ? Hypertension Mother   ? Kidney disease Mother   ? Cancer Father   ? Cancer Paternal Grandmother   ? ? ? ?Current Outpatient Medications:  ?  atorvastatin (LIPITOR) 10 MG tablet, TAKE 1 TABLET BY MOUTH MON WED AND FRI, Disp: 36 tablet, Rfl: 1 ?  Blood Glucose Monitoring Suppl (ONETOUCH VERIO) w/Device KIT, 1 Device by Does not apply route daily. Use as directed to check blood sugars 1 time daily, Disp: 1 kit, Rfl: 0 ?  glucose blood (ONETOUCH VERIO) test strip, Use as instructed to check blood sugars 1 time daily, Disp: 150 each, Rfl: 2 ?  ibuprofen (ADVIL,MOTRIN) 600 MG tablet, Take 1 tablet (600 mg total) by mouth every 6 (six) hours as needed., Disp: 30 tablet, Rfl: 0 ?  NEEDLE, DISP, 25 G (EASY TOUCH FLIPLOCK NEEDLES) 25G X 1" MISC, Use with testosterone 2 times a week, Disp: 50 each, Rfl: 3 ?  sildenafil (VIAGRA) 100 MG tablet, TAKE 1 TABLET BY MOUTH EVERY DAY AS NEEDED FOR ERECTILE DYSFUNCTION, Disp: 14 tablet, Rfl: 0 ?  testosterone cypionate (DEPOTESTOTERONE CYPIONATE) 100 MG/ML injection, INJECT 1 ML (100 MG TOTAL) INTO THE MUSCLE 2 TIMES A WEEK. (DISCARD VIAL 28 DAYS AFTER PUNCTURES), Disp: 10 mL, Rfl: 5 ?  Vitamin D, Ergocalciferol, (DRISDOL) 1.25 MG (50000 UNIT) CAPS capsule, Take 1 capsule (50,000 Units total) by mouth 2 (two) times a week., Disp: 24 capsule, Rfl:  1 ?  lisinopril (ZESTRIL) 10 MG tablet, TAKE 1 TABLET BY MOUTH EVERY DAY, Disp: 90 tablet, Rfl: 1 ?  sitaGLIPtin-metformin (JANUMET) 50-1000 MG tablet, TAKE 1 TABLET BY MOUTH TWICE A DAY WITH A MEAL, Disp: 180 tablet, Rfl: 1 ?  valACYclovir (VALTREX) 500 MG tablet, Take 1 tablet (500 mg total) by mouth 2 (two) times daily for 5 days., Disp: 10 tablet, Rfl: 0 ?  zolpidem (AMBIEN) 10 MG tablet, TAKE 1 TABLET BY MOUTH EVERY DAY AT BEDTIME AS NEEDED FOR SLEEP. QTY PER INSURANCE., Disp: 15 tablet, Rfl: 5  ? ?No Known Allergies  ? ?Review of  Systems  ?Constitutional: Negative.   ?Eyes:  Negative for blurred vision.  ?Respiratory: Negative.    ?Cardiovascular: Negative.   ?Gastrointestinal: Negative.   ?Endocrine: Negative for polydipsia, polyphagia and polyuria.  ?Neurological: Negative.   ?Psychiatric/Behavioral:  Positive for sleep disturbance. The patient has insomnia.    ? ?Today's Vitals  ? 04/14/21 1620  ?BP: 130/70  ?Pulse: 83  ?Temp: 98.7 ?F (37.1 ?C)  ?TempSrc: Oral  ?Weight: 219 lb (99.3 kg)  ?Height: 5' 7.8" (1.722 m)  ? ?Body mass index is 33.5 kg/m?.  ?Wt Readings from Last 3 Encounters:  ?04/14/21 219 lb (99.3 kg)  ?07/27/20 230 lb 9.6 oz (104.6 kg)  ?04/06/20 221 lb 12.8 oz (100.6 kg)  ? ? ?Objective:  ?Physical Exam ?Vitals reviewed.  ?Constitutional:   ?   Appearance: Normal appearance. He is obese.  ?Cardiovascular:  ?   Rate and Rhythm: Normal rate and regular rhythm.  ?   Pulses: Normal pulses.  ?   Heart sounds: Normal heart sounds. No murmur heard. ?Pulmonary:  ?   Effort: No respiratory distress.  ?   Breath sounds: Normal breath sounds.  ?Musculoskeletal:     ?   General: No swelling.  ?Skin: ?   General: Skin is warm and dry.  ?   Capillary Refill: Capillary refill takes less than 2 seconds.  ?Neurological:  ?   General: No focal deficit present.  ?   Mental Status: He is alert and oriented to person, place, and time.  ?   Cranial Nerves: No cranial nerve deficit.  ?   Motor: No weakness.  ?Psychiatric:     ?   Mood and Affect: Mood normal.     ?   Behavior: Behavior normal.     ?   Thought Content: Thought content normal.     ?   Judgment: Judgment normal.  ?  ? ?   ?Assessment And Plan:  ?   ?1. Type 2 diabetes mellitus with other circulatory complication, without long-term current use of insulin ([place]) ?Poorly controlled ?Continue with current medications ?Encouraged to limit intake of sugary foods and drinks ?Encouraged to increase physical activity to 150 minutes per week ?Diabetic foot exam done, no abnormal findings ?-  Hemoglobin A1c ?- Lipid panel ?- sitaGLIPtin-metformin (JANUMET) 50-1000 MG tablet; TAKE 1 TABLET BY MOUTH TWICE A DAY WITH A MEAL  Dispense: 180 tablet; Refill: 1 ?- Microalbumin / Creatinine Urine Ratio ? ?2. Hypertensive heart disease without heart failure ?B/P is controlled.  ?CMP ordered to check renal function.  ?The importance of regular exercise and dietary modification was stressed to the patient.  ?- CMP14+EGFR ? ?3. Insomnia, unspecified type ?Comments: Stable, no changes ? ?4. Erectile dysfunction due to diseases classified elsewhere ?Comments: Continue follow up with Urology ? ?5. Class 1 obesity due to excess calories with serious comorbidity and body  mass index (BMI) of 33.0 to 33.9 in adult ?Comments: Continue with regular exercise of 150 minutes a week and healthy diet. She is encouraged to strive for BMI less than 30 to decrease cardiac risk. Advised to aim for at least 150 minutes of exercise per week. ? ?6. Encounter for screening colonoscopy ?According to USPTF Colorectal cancer Screening guidelines. Colonoscopy is recommended every 10 years, starting at age 36 years. ?Will refer to GI for colon cancer screening. ?- Ambulatory referral to Gastroenterology ? ?7. Screening for STD (sexually transmitted disease) ?- Urine cytology ancillary only ?- HSV(herpes simplex vrs) 1+2 ab-IgG ?- Hepatitis B surface antigen ?- T pallidum Screening [place] ? ?8. Encounter for HIV (human immunodeficiency virus) test ?- HIV antibody (with reflex) ? ? ? ?Patient was given opportunity to ask questions. Patient verbalized understanding of the plan and was able to repeat key elements of the plan. All questions were answered to their satisfaction.  ?Minette Brine, FNP  ? ?I, Minette Brine, FNP, have reviewed all documentation for this visit. The documentation on 04/14/21 for the exam, diagnosis, procedures, and orders are all accurate and complete.  ? ?IF YOU HAVE BEEN REFERRED TO A SPECIALIST, IT MAY TAKE 1-2 WEEKS TO  SCHEDULE/PROCESS THE REFERRAL. IF YOU HAVE NOT HEARD FROM US/SPECIALIST IN TWO WEEKS, PLEASE GIVE [place] A CALL AT [phone].  ? ?THE PATIENT IS ENCOURAGED TO PRACTICE SOCIAL DISTANCING DUE TO THE COVID-

## 2021-04-14 NOTE — Patient Instructions (Signed)

## 2021-04-15 LAB — CMP14+EGFR
ALT: 24 IU/L (ref 0–44)
AST: 20 IU/L (ref 0–40)
Albumin/Globulin Ratio: 1.6 (ref 1.2–2.2)
Albumin: 4.5 g/dL (ref 3.8–4.9)
Alkaline Phosphatase: 95 IU/L (ref 44–121)
BUN/Creatinine Ratio: 12 (ref 9–20)
BUN: 13 mg/dL (ref 6–24)
Bilirubin Total: <0.2 mg/dL (ref 0.0–1.2)
CO2: 23 mmol/L (ref 20–29)
Calcium: 9.7 mg/dL (ref 8.7–10.2)
Chloride: 102 mmol/L (ref 96–106)
Creatinine, Ser: 1.12 mg/dL (ref 0.76–1.27)
Globulin, Total: 2.9 g/dL (ref 1.5–4.5)
Glucose: 120 mg/dL — ABNORMAL HIGH (ref 70–99)
Potassium: 4.4 mmol/L (ref 3.5–5.2)
Sodium: 139 mmol/L (ref 134–144)
Total Protein: 7.4 g/dL (ref 6.0–8.5)
eGFR: 76 mL/min/{1.73_m2} (ref >59)

## 2021-04-15 LAB — HEPATITIS B SURFACE ANTIGEN: Hepatitis B Surface Ag: NEGATIVE

## 2021-04-15 LAB — HSV(HERPES SIMPLEX VRS) I + II AB-IGG
HSV 1 Glycoprotein G Ab, IgG: 30.8 index — ABNORMAL HIGH (ref 0.00–0.90)
HSV 2 IgG, Type Spec: >23.6 index — ABNORMAL HIGH (ref 0.00–0.90)

## 2021-04-15 LAB — HEMOGLOBIN A1C
Est. average glucose Bld gHb Est-mCnc: 160 mg/dL
Hgb A1c MFr Bld: 7.2 % — ABNORMAL HIGH (ref 4.8–5.6)

## 2021-04-15 LAB — LIPID PANEL
Chol/HDL Ratio: 2.5 ratio (ref 0.0–5.0)
Cholesterol, Total: 107 mg/dL (ref 100–199)
HDL: 42 mg/dL (ref >39)
LDL Chol Calc (NIH): 50 mg/dL (ref 0–99)
Triglycerides: 73 mg/dL (ref 0–149)
VLDL Cholesterol Cal: 15 mg/dL (ref 5–40)

## 2021-04-15 LAB — HIV ANTIBODY (ROUTINE TESTING W REFLEX): HIV Screen 4th Generation wRfx: NONREACTIVE

## 2021-04-15 LAB — T PALLIDUM SCREENING CASCADE: T pallidum Antibodies (TP-PA): NONREACTIVE

## 2021-04-17 ENCOUNTER — Other Ambulatory Visit: Payer: Self-pay | Admitting: Nurse Practitioner

## 2021-04-17 DIAGNOSIS — G4700 Insomnia, unspecified: Secondary | ICD-10-CM

## 2021-04-18 ENCOUNTER — Other Ambulatory Visit: Payer: Self-pay | Admitting: Nurse Practitioner

## 2021-04-18 LAB — URINE CYTOLOGY ANCILLARY ONLY
Chlamydia: NEGATIVE
Comment: NEGATIVE
Comment: NEGATIVE
Comment: NORMAL
Neisseria Gonorrhea: NEGATIVE
Trichomonas: NEGATIVE

## 2021-04-19 ENCOUNTER — Telehealth: Payer: Self-pay | Admitting: Nurse Practitioner

## 2021-04-19 ENCOUNTER — Other Ambulatory Visit: Payer: Self-pay | Admitting: Nurse Practitioner

## 2021-04-19 DIAGNOSIS — B009 Herpesviral infection, unspecified: Secondary | ICD-10-CM

## 2021-04-19 MED ORDER — VALACYCLOVIR HCL 500 MG PO TABS: 500.0000 mg | ORAL_TABLET | Freq: Two times a day (BID) | ORAL | 0 refills | Status: AC

## 2021-04-19 NOTE — Telephone Encounter (Signed)
Called patient to discuss lab results and called in valtrex to take 2 times a day for 5 days and may need to have a daily rx. Also encouraged to take Lysine supplement as well to help with prevention. ?He also would need to discuss with his partner for possible treatment ?

## 2021-05-19 DIAGNOSIS — E291 Testicular hypofunction: Secondary | ICD-10-CM | POA: Diagnosis not present

## 2021-05-27 DIAGNOSIS — E291 Testicular hypofunction: Secondary | ICD-10-CM | POA: Diagnosis not present

## 2021-05-27 DIAGNOSIS — N5201 Erectile dysfunction due to arterial insufficiency: Secondary | ICD-10-CM | POA: Diagnosis not present

## 2021-06-07 ENCOUNTER — Other Ambulatory Visit: Payer: Self-pay | Admitting: Nurse Practitioner

## 2021-06-10 DIAGNOSIS — M79632 Pain in left forearm: Secondary | ICD-10-CM | POA: Diagnosis not present

## 2021-06-14 ENCOUNTER — Other Ambulatory Visit: Payer: Self-pay | Admitting: Nurse Practitioner

## 2021-06-14 DIAGNOSIS — N529 Male erectile dysfunction, unspecified: Secondary | ICD-10-CM

## 2021-06-29 DIAGNOSIS — M79632 Pain in left forearm: Secondary | ICD-10-CM | POA: Diagnosis not present

## 2021-07-20 ENCOUNTER — Ambulatory Visit: Payer: BC Managed Care – PPO | Admitting: Nurse Practitioner

## 2021-07-23 ENCOUNTER — Other Ambulatory Visit: Payer: Self-pay | Admitting: Nurse Practitioner

## 2021-07-23 DIAGNOSIS — G4700 Insomnia, unspecified: Secondary | ICD-10-CM

## 2021-08-03 ENCOUNTER — Ambulatory Visit: Payer: BC Managed Care – PPO | Admitting: Nurse Practitioner

## 2021-09-30 ENCOUNTER — Other Ambulatory Visit: Payer: Self-pay | Admitting: Nurse Practitioner

## 2021-09-30 DIAGNOSIS — G4700 Insomnia, unspecified: Secondary | ICD-10-CM

## 2021-10-20 ENCOUNTER — Ambulatory Visit: Payer: BC Managed Care – PPO | Admitting: Nurse Practitioner

## 2021-10-20 ENCOUNTER — Encounter: Payer: Self-pay | Admitting: Nurse Practitioner

## 2021-10-20 VITALS — BP 130/88 | HR 78 | Temp 97.9°F | Ht 67.0 in | Wt 221.6 lb

## 2021-10-20 DIAGNOSIS — E1165 Type 2 diabetes mellitus with hyperglycemia: Secondary | ICD-10-CM

## 2021-10-20 DIAGNOSIS — Z2821 Immunization not carried out because of patient refusal: Secondary | ICD-10-CM

## 2021-10-20 DIAGNOSIS — G4700 Insomnia, unspecified: Secondary | ICD-10-CM

## 2021-10-20 DIAGNOSIS — I119 Hypertensive heart disease without heart failure: Secondary | ICD-10-CM | POA: Diagnosis not present

## 2021-10-20 DIAGNOSIS — Z139 Encounter for screening, unspecified: Secondary | ICD-10-CM

## 2021-10-20 DIAGNOSIS — E559 Vitamin D deficiency, unspecified: Secondary | ICD-10-CM | POA: Diagnosis not present

## 2021-10-20 MED ORDER — JANUMET 50-1000 MG PO TABS: ORAL_TABLET | ORAL | 1 refills | Status: DC

## 2021-10-20 MED ORDER — LISINOPRIL 10 MG PO TABS: 10.0000 mg | ORAL_TABLET | Freq: Every day | ORAL | 1 refills | Status: DC

## 2021-10-20 MED ORDER — BELSOMRA 10 MG PO TABS: 1.0000 | ORAL_TABLET | Freq: Every day | ORAL | 2 refills | Status: DC

## 2021-10-20 NOTE — Progress Notes (Signed)
I,Tianna Badgett,acting as a Neurosurgeon for [employer], FNP.,have documented all relevant documentation on the behalf of Arnette Felts, FNP,as directed by  Arnette Felts, FNP while in the presence of Arnette Felts, FNP.  Subjective:     Patient ID: David Humphrey , male    DOB: 08-Nov-1960 , 61 y.o.   MRN: [id number]   Chief Complaint  Patient presents with   Diabetes    HPI  Patient presents today for a dm and insomnia f/u.   Diabetes He presents for his follow-up diabetic visit. He has type 2 diabetes mellitus. There are no hypoglycemic associated symptoms. Pertinent negatives for diabetes include no blurred vision, no polydipsia, no polyphagia and no polyuria. There are no hypoglycemic complications. There are no diabetic complications. There are no known risk factors for coronary artery disease. When asked about current treatments, none were reported. He is compliant with treatment all of the time. He is following a generally healthy diet. He has not had a previous visit with a dietitian. (Blood sugars are up to 200's. , he does admit to not eating his best. Not exercising as much. ) He does not see a podiatrist.Eye exam is not current (he had a call from an opthalmologist for his eye exam).  Insomnia Primary symptoms: sleep disturbance, frequent awakening.   PMH includes: no hypertension.      Past Medical History:  Diagnosis Date   Diabetes mellitus without complication (HCC)    Hypertension      Family History  Problem Relation Age of Onset   Hypertension Mother    Kidney disease Mother    Cancer Father    Cancer Paternal Grandmother      Current Outpatient Medications:    atorvastatin (LIPITOR) 10 MG tablet, TAKE 1 TABLET BY MOUTH MONDAY, WEDNESDAY AND FRIDAY, Disp: 36 tablet, Rfl: 1   Blood Glucose Monitoring Suppl (ONETOUCH VERIO) w/Device KIT, 1 Device by Does not apply route daily. Use as directed to check blood sugars 1 time daily, Disp: 1 kit, Rfl: 0   glucose blood  (ONETOUCH VERIO) test strip, Use as instructed to check blood sugars 1 time daily, Disp: 150 each, Rfl: 2   ibuprofen (ADVIL,MOTRIN) 600 MG tablet, Take 1 tablet (600 mg total) by mouth every 6 (six) hours as needed., Disp: 30 tablet, Rfl: 0   NEEDLE, DISP, 25 G (EASY TOUCH FLIPLOCK NEEDLES) 25G X 1" MISC, Use with testosterone 2 times a week, Disp: 50 each, Rfl: 3   sildenafil (VIAGRA) 100 MG tablet, TAKE 1 TABLET BY MOUTH EVERY DAY AS NEEDED FOR ERECTILE DYSFUNCTION, Disp: 6 tablet, Rfl: 2   Suvorexant (BELSOMRA) 10 MG TABS, Take 1 tablet by mouth daily., Disp: 30 tablet, Rfl: 2   testosterone cypionate (DEPOTESTOTERONE CYPIONATE) 100 MG/ML injection, INJECT 1 ML (100 MG TOTAL) INTO THE MUSCLE 2 TIMES A WEEK. (DISCARD VIAL 28 DAYS AFTER PUNCTURES), Disp: 10 mL, Rfl: 5   Vitamin D, Ergocalciferol, (DRISDOL) 1.25 MG (50000 UNIT) CAPS capsule, Take 1 capsule (50,000 Units total) by mouth 2 (two) times a week., Disp: 24 capsule, Rfl: 1   lisinopril (ZESTRIL) 10 MG tablet, Take 1 tablet (10 mg total) by mouth daily., Disp: 90 tablet, Rfl: 1   sitaGLIPtin-metformin (JANUMET) 50-1000 MG tablet, TAKE 1 TABLET BY MOUTH TWICE A DAY WITH A MEAL, Disp: 180 tablet, Rfl: 1   No Known Allergies   Review of Systems  Constitutional: Negative.   Eyes:  Negative for blurred vision.  Respiratory: Negative.  Cardiovascular: Negative.   Gastrointestinal: Negative.   Endocrine: Negative for polydipsia, polyphagia and polyuria.  Neurological: Negative.   Psychiatric/Behavioral:  Positive for sleep disturbance. The patient has insomnia.      Today's Vitals   10/20/21 0847  BP: 130/88  Pulse: 78  Temp: 97.9 F (36.6 C)  TempSrc: Oral  Weight: 221 lb 9.6 oz (100.5 kg)  Height: 5\' 7"  (1.702 m)   Body mass index is 34.71 kg/m.   Objective:  Physical Exam Vitals reviewed.  Constitutional:      General: He is not in acute distress.    Appearance: Normal appearance. He is obese.  Cardiovascular:      Rate and Rhythm: Normal rate and regular rhythm.     Pulses: Normal pulses.     Heart sounds: Normal heart sounds. No murmur heard. Pulmonary:     Effort: No respiratory distress.     Breath sounds: Normal breath sounds.  Musculoskeletal:        General: No swelling.  Skin:    General: Skin is warm and dry.     Capillary Refill: Capillary refill takes less than 2 seconds.  Neurological:     General: No focal deficit present.     Mental Status: He is alert and oriented to person, place, and time.     Cranial Nerves: No cranial nerve deficit.     Motor: No weakness.  Psychiatric:        Mood and Affect: Mood normal.        Behavior: Behavior normal.        Thought Content: Thought content normal.        Judgment: Judgment normal.         Assessment And Plan:     1. Type 2 diabetes mellitus with hyperglycemia, without long-term current use of insulin (HCC) Comments: Continue current medications. He is not interested in an injection for diabetes, he would benefit from a GLP1, may consider adding Rybelsus pending labs.  - BMP8+EGFR - Hemoglobin A1c - Microalbumin / Creatinine Urine Ratio - sitaGLIPtin-metformin (JANUMET) 50-1000 MG tablet; TAKE 1 TABLET BY MOUTH TWICE A DAY WITH A MEAL  Dispense: 180 tablet; Refill: 1  2. Hypertensive heart disease without heart failure Comments: Blood pressure is fairly controlled, continue current medications - lisinopril (ZESTRIL) 10 MG tablet; Take 1 tablet (10 mg total) by mouth daily.  Dispense: 90 tablet; Refill: 1  3. Insomnia, unspecified type Comments: Will try him on Belsomra, he has been on Ambien since 2006 due to a traumatic event. Encouraged may benefit from counseling not ready at this time  4. Vitamin D deficiency Will check vitamin D level and supplement as needed.    Also encouraged to spend 15 minutes in the sun daily.  - VITAMIN D 25 Hydroxy (Vit-D Deficiency, Fractures)  5. Influenza vaccination declined Patient  declined influenza vaccination at this time. Patient is aware that influenza vaccine prevents illness in 70% of healthy people, and reduces hospitalizations to 30-70% in elderly. This vaccine is recommended annually. Education has been provided regarding the importance of this vaccine but patient still declined. Advised may receive this vaccine at local pharmacy or Health Dept.or vaccine clinic. Aware to provide a copy of the vaccination record if obtained from local pharmacy or Health Dept.  Pt is willing to accept risk associated with refusing vaccination.  6. Herpes zoster vaccination declined Comments: He would like a zoster titer due to not remembering if he had chicken pox.  7. Encounter for screening - Varicella zoster antibody, IgM   Advised to schedule his colonoscopy and diabetes eye exam.   Patient was given opportunity to ask questions. Patient verbalized understanding of the plan and was able to repeat key elements of the plan. All questions were answered to their satisfaction.  Minette Brine, FNP   I, Minette Brine, FNP, have reviewed all documentation for this visit. The documentation on 10/20/21 for the exam, diagnosis, procedures, and orders are all accurate and complete.   IF YOU HAVE BEEN REFERRED TO A SPECIALIST, IT MAY TAKE 1-2 WEEKS TO SCHEDULE/PROCESS THE REFERRAL. IF YOU HAVE NOT HEARD FROM US/SPECIALIST IN TWO WEEKS, PLEASE GIVE [place] A CALL AT [phone].   THE PATIENT IS ENCOURAGED TO PRACTICE SOCIAL DISTANCING DUE TO THE COVID-19 PANDEMIC.

## 2021-10-20 NOTE — Patient Instructions (Signed)

## 2021-10-21 LAB — BMP8+EGFR
BUN/Creatinine Ratio: 8 — ABNORMAL LOW (ref 9–20)
BUN: 9 mg/dL (ref 6–24)
CO2: 21 mmol/L (ref 20–29)
Calcium: 9.7 mg/dL (ref 8.7–10.2)
Chloride: 104 mmol/L (ref 96–106)
Creatinine, Ser: 1.11 mg/dL (ref 0.76–1.27)
Glucose: 142 mg/dL — ABNORMAL HIGH (ref 70–99)
Potassium: 3.8 mmol/L (ref 3.5–5.2)
Sodium: 143 mmol/L (ref 134–144)
eGFR: 76 mL/min/{1.73_m2} (ref >59)

## 2021-10-21 LAB — HEMOGLOBIN A1C
Est. average glucose Bld gHb Est-mCnc: 126 mg/dL
Hgb A1c MFr Bld: 6 % — ABNORMAL HIGH (ref 4.8–5.6)

## 2021-10-21 LAB — MICROALBUMIN / CREATININE URINE RATIO
Creatinine, Urine: 225.3 mg/dL
Microalb/Creat Ratio: 2 mg/g creat (ref 0–29)
Microalbumin, Urine: 4.7 ug/mL

## 2021-10-21 LAB — VITAMIN D 25 HYDROXY (VIT D DEFICIENCY, FRACTURES): Vit D, 25-Hydroxy: 25.5 ng/mL — ABNORMAL LOW (ref 30.0–100.0)

## 2021-10-21 LAB — VARICELLA ZOSTER ANTIBODY, IGM: Varicella IgM: <0.91 index (ref 0.00–0.90)

## 2021-11-09 ENCOUNTER — Other Ambulatory Visit: Payer: Self-pay | Admitting: Nurse Practitioner

## 2021-11-09 DIAGNOSIS — E559 Vitamin D deficiency, unspecified: Secondary | ICD-10-CM

## 2021-11-09 MED ORDER — VITAMIN D (ERGOCALCIFEROL) 1.25 MG (50000 UNIT) PO CAPS: 50000.0000 [IU] | ORAL_CAPSULE | ORAL | 1 refills | Status: DC

## 2021-12-23 ENCOUNTER — Other Ambulatory Visit: Payer: Self-pay | Admitting: Nurse Practitioner

## 2021-12-23 DIAGNOSIS — N529 Male erectile dysfunction, unspecified: Secondary | ICD-10-CM

## 2022-01-25 ENCOUNTER — Ambulatory Visit: Payer: BC Managed Care – PPO | Admitting: Nurse Practitioner

## 2022-01-26 ENCOUNTER — Ambulatory Visit
Admission: EM | Admit: 2022-01-26 | Discharge: 2022-01-26 | Disposition: A | Discharge status: Home / Self Care | Payer: BC Managed Care – PPO | Attending: Emergency Medicine | Admitting: Emergency Medicine

## 2022-01-26 DIAGNOSIS — I10 Essential (primary) hypertension: Secondary | ICD-10-CM | POA: Diagnosis not present

## 2022-01-26 DIAGNOSIS — B349 Viral infection, unspecified: Secondary | ICD-10-CM | POA: Diagnosis not present

## 2022-01-26 DIAGNOSIS — R051 Acute cough: Secondary | ICD-10-CM | POA: Diagnosis not present

## 2022-01-26 MED ORDER — PROMETHAZINE-DM 6.25-15 MG/5ML PO SYRP: 5.0000 mL | ORAL_SOLUTION | Freq: Four times a day (QID) | ORAL | 0 refills | Status: DC | PRN

## 2022-01-26 MED ORDER — BENZONATATE 100 MG PO CAPS: 100.0000 mg | ORAL_CAPSULE | Freq: Three times a day (TID) | ORAL | 0 refills | Status: DC | PRN

## 2022-01-26 NOTE — ED Provider Notes (Signed)
David Humphrey    CSN: [id number] Arrival date & time: 01/26/22  1332      History   Chief Complaint Chief Complaint  Patient presents with   Cough   Nasal Congestion    HPI David Humphrey is a 62 y.o. male.   Patient presents with 5-day history of congestion and cough.  He also reports headache, decreased taste, decreased smell.  Sputum is clear.  He had fever at the onset of his symptoms but none in the last few days.  He has had a couple of episodes of emesis; none in the last 2 days.  He denies ear pain, sore throat, chest pain, shortness of breath, diarrhea, or other symptoms.  Negative COVID test at home.  His medical history includes diabetes and HTN.   The history is provided by the patient and medical records.    Past Medical History:  Diagnosis Date   Diabetes mellitus without complication ([facility])    Hypertension     Patient Active Problem List   Diagnosis Date Noted   Type 2 diabetes mellitus without complication, without long-term current use of insulin ([place]) 01/07/2020   Insomnia 05/21/2018   Microalbuminuria 04/09/2018   Essential hypertension 10/13/2017   Type 2 diabetes mellitus with hyperglycemia ([place]) 10/13/2017    History reviewed. No pertinent surgical history.     Home Medications    Prior to Admission medications   Medication Sig Start Date End Date Taking? Authorizing Provider  benzonatate (TESSALON) 100 MG capsule Take 1 capsule (100 mg total) by mouth 3 (three) times daily as needed for cough. 01/26/22  Yes Sharion Balloon, NP  promethazine-dextromethorphan (PROMETHAZINE-DM) 6.25-15 MG/5ML syrup Take 5 mLs by mouth 4 (four) times daily as needed. 01/26/22  Yes Sharion Balloon, NP  atorvastatin (LIPITOR) 10 MG tablet TAKE 1 TABLET BY MOUTH MONDAY, [facility] AND FRIDAY 06/07/21   Minette Brine, FNP  Blood Glucose Monitoring Suppl (ONETOUCH VERIO) w/Device KIT 1 Device by Does not apply route daily. Use as directed to check blood sugars 1 time daily  06/24/19   Minette Brine, FNP  glucose blood (ONETOUCH VERIO) test strip Use as instructed to check blood sugars 1 time daily 06/24/19   Minette Brine, FNP  ibuprofen (ADVIL,MOTRIN) 600 MG tablet Take 1 tablet (600 mg total) by mouth every 6 (six) hours as needed. 03/06/17   Melynda Ripple, MD  lisinopril (ZESTRIL) 10 MG tablet Take 1 tablet (10 mg total) by mouth daily. 10/20/21   Minette Brine, FNP  NEEDLE, DISP, 25 G (EASY TOUCH FLIPLOCK NEEDLES) 25G X 1" MISC Use with testosterone 2 times a week 06/26/19   Minette Brine, FNP  sildenafil (VIAGRA) 100 MG tablet TAKE 1 TABLET BY MOUTH EVERY DAY AS NEEDED FOR ERECTILE DYSFUNCTION 12/26/21   Minette Brine, FNP  sitaGLIPtin-metformin (JANUMET) 50-1000 MG tablet TAKE 1 TABLET BY MOUTH TWICE A DAY WITH A MEAL 10/20/21   Minette Brine, FNP  Suvorexant (BELSOMRA) 10 MG TABS Take 1 tablet by mouth daily. 10/20/21   Minette Brine, FNP  testosterone cypionate (DEPOTESTOTERONE CYPIONATE) 100 MG/ML injection INJECT 1 ML (100 MG TOTAL) INTO THE MUSCLE 2 TIMES A WEEK. (DISCARD VIAL 28 DAYS AFTER PUNCTURES) 12/15/20   Minette Brine, FNP  Vitamin D, Ergocalciferol, (DRISDOL) 1.25 MG (50000 UNIT) CAPS capsule Take 1 capsule (50,000 Units total) by mouth 2 (two) times a week. 11/10/21   Minette Brine, FNP    Family History Family History  Problem Relation Age of Onset  Hypertension Mother    Kidney disease Mother    Cancer Father    Cancer Paternal Grandmother     Social History Social History   Tobacco Use   Smoking status: Never   Smokeless tobacco: Never  Vaping Use   Vaping Use: Never used  Substance Use Topics   Alcohol use: Yes    Comment: socially   Drug use: No     Allergies   Patient has no known allergies.   Review of Systems Review of Systems  Constitutional:  Positive for fever. Negative for chills.  HENT:  Positive for congestion. Negative for ear pain and sore throat.   Respiratory:  Positive for cough. Negative for shortness of  breath.   Cardiovascular:  Negative for chest pain and palpitations.  Gastrointestinal:  Positive for vomiting. Negative for abdominal pain and diarrhea.  Skin:  Negative for rash.  Neurological:  Positive for headaches.  All other systems reviewed and are negative.    Physical Exam Triage Vital Signs ED Triage Vitals  Enc Vitals Group     BP 01/26/22 1400 (!) 156/78     Pulse Rate 01/26/22 1400 77     Resp 01/26/22 1400 18     Temp 01/26/22 1400 98.7 F (37.1 C)     Temp src --      SpO2 01/26/22 1400 98 %     Weight 01/26/22 1358 215 lb (97.5 kg)     Height 01/26/22 1358 _0  (1.753 m)     Head Circumference --      Peak Flow --      Pain Score 01/26/22 1355 6     Pain Loc --      Pain Edu? --      Excl. in [place]? --    No data found.  Updated Vital Signs BP (!) 156/78   Pulse 77   Temp 98.7 F (37.1 C)   Resp 18   Ht _1  (1.753 m)   Wt 215 lb (97.5 kg)   SpO2 98%   BMI 31.75 kg/m   Visual Acuity Right Eye Distance:   Left Eye Distance:   Bilateral Distance:    Right Eye Near:   Left Eye Near:    Bilateral Near:     Physical Exam Vitals and nursing note reviewed.  Constitutional:      General: He is not in acute distress.    Appearance: Normal appearance. He is well-developed. He is not ill-appearing.  HENT:     Right Ear: Tympanic membrane normal.     Left Ear: Tympanic membrane normal.     Nose: Nose normal.     Mouth/Throat:     Mouth: Mucous membranes are moist.     Pharynx: Oropharynx is clear.  Cardiovascular:     Rate and Rhythm: Normal rate and regular rhythm.     Heart sounds: Normal heart sounds.  Pulmonary:     Effort: Pulmonary effort is normal. No respiratory distress.     Breath sounds: Normal breath sounds.  Abdominal:     General: Bowel sounds are normal.     Palpations: Abdomen is soft.     Tenderness: There is no abdominal tenderness. There is no guarding or rebound.  Musculoskeletal:     Cervical back: Neck supple.   Skin:    General: Skin is warm and dry.  Neurological:     Mental Status: He is alert.  Psychiatric:        Mood  and Affect: Mood normal.        Behavior: Behavior normal.      UC Treatments / Results  Labs (all labs ordered are listed, but only abnormal results are displayed) Labs Reviewed - No data to display  EKG   Radiology No results found.  Procedures Procedures (including critical care time)  Medications Ordered in UC Medications - No data to display  Initial Impression / Assessment and Plan / UC Course  I have reviewed the triage vital signs and the nursing notes.  Pertinent labs & imaging results that were available during my care of the patient were reviewed by me and considered in my medical decision making (see chart for details).    Cough, Viral illness, Elevated blood pressure with hypertension.  Symptoms have improved in the last 2 days.  Treating cough with Tessalon Perles and promethazine DM.  Precautions for drowsiness with promethazine discussed.  Education provided on cough and viral illness.  Also discussed with patient that his blood pressure is elevated today and needs to be rechecked by PCP in 2 to 4 weeks.  Education provided on managing hypertension.  Cautioned patient to avoid OTC medications that elevated blood pressure.  He agrees to plan of care.  Final Clinical Impressions(s) / UC Diagnoses   Final diagnoses:  Acute cough  Viral illness  Elevated blood pressure reading in office with diagnosis of hypertension     Discharge Instructions      Take the Tessalon Perles and promethazine DM as directed.  Do not drive, operate machinery, drink alcohol, or perform dangerous activities while taking promethazine DM as it may cause drowsiness.   Follow up with your primary care provider if your symptoms are not improving.    Your blood pressure is elevated today at 156/78.  Please have this rechecked by your primary care provider in 2-4 weeks.           ED Prescriptions     Medication Sig Dispense Auth. Provider   benzonatate (TESSALON) 100 MG capsule Take 1 capsule (100 mg total) by mouth 3 (three) times daily as needed for cough. 21 capsule Sharion Balloon, NP   promethazine-dextromethorphan (PROMETHAZINE-DM) 6.25-15 MG/5ML syrup Take 5 mLs by mouth 4 (four) times daily as needed. 118 mL Sharion Balloon, NP      PDMP not reviewed this encounter.   Sharion Balloon, NP 01/26/22 1440

## 2022-01-26 NOTE — Discharge Instructions (Addendum)
Take the Tessalon Perles and promethazine DM as directed.  Do not drive, operate machinery, drink alcohol, or perform dangerous activities while taking promethazine DM as it may cause drowsiness.   Follow up with your primary care provider if your symptoms are not improving.    Your blood pressure is elevated today at 156/78.  Please have this rechecked by your primary care provider in 2-4 weeks.

## 2022-01-26 NOTE — ED Triage Notes (Addendum)
Patient to Urgent Care with complaints of chest and nasal congestion, headache, and cough x5 days. Reports diminished smell and taste. Describes cough as productive but being unable to produce much. Reports feeling feverish at the beginning of his illness.   Has been taking otc cold and flu medications including alker-seltzer. States they help during the day but symptoms seem to worsen at night.  Negative covid test at home.

## 2022-02-21 ENCOUNTER — Ambulatory Visit (INDEPENDENT_AMBULATORY_CARE_PROVIDER_SITE_OTHER): Payer: BC Managed Care – PPO | Admitting: Nurse Practitioner

## 2022-02-21 ENCOUNTER — Encounter: Payer: Self-pay | Admitting: Nurse Practitioner

## 2022-02-21 VITALS — BP 160/90 | HR 76 | Temp 98.7°F | Ht 67.4 in | Wt 230.2 lb

## 2022-02-21 DIAGNOSIS — Z0001 Encounter for general adult medical examination with abnormal findings: Secondary | ICD-10-CM | POA: Diagnosis not present

## 2022-02-21 DIAGNOSIS — Z79899 Other long term (current) drug therapy: Secondary | ICD-10-CM

## 2022-02-21 DIAGNOSIS — Z0000 Encounter for general adult medical examination without abnormal findings: Secondary | ICD-10-CM

## 2022-02-21 DIAGNOSIS — R053 Chronic cough: Secondary | ICD-10-CM

## 2022-02-21 DIAGNOSIS — F5101 Primary insomnia: Secondary | ICD-10-CM

## 2022-02-21 DIAGNOSIS — I119 Hypertensive heart disease without heart failure: Secondary | ICD-10-CM

## 2022-02-21 DIAGNOSIS — R0989 Other specified symptoms and signs involving the circulatory and respiratory systems: Secondary | ICD-10-CM | POA: Diagnosis not present

## 2022-02-21 DIAGNOSIS — E559 Vitamin D deficiency, unspecified: Secondary | ICD-10-CM

## 2022-02-21 DIAGNOSIS — Z125 Encounter for screening for malignant neoplasm of prostate: Secondary | ICD-10-CM

## 2022-02-21 DIAGNOSIS — E1165 Type 2 diabetes mellitus with hyperglycemia: Secondary | ICD-10-CM | POA: Diagnosis not present

## 2022-02-21 DIAGNOSIS — R9431 Abnormal electrocardiogram [ECG] [EKG]: Secondary | ICD-10-CM

## 2022-02-21 LAB — POCT URINALYSIS DIPSTICK
Bilirubin, UA: NEGATIVE
Blood, UA: NEGATIVE
Glucose, UA: POSITIVE — AB
Ketones, UA: NEGATIVE
Leukocytes, UA: NEGATIVE
Nitrite, UA: NEGATIVE
Protein, UA: POSITIVE — AB
Spec Grav, UA: >=1.03 — AB (ref 1.010–1.025)
Urobilinogen, UA: 0.2 E.U./dL
pH, UA: 6 (ref 5.0–8.0)

## 2022-02-21 MED ORDER — AMOXICILLIN 875 MG PO TABS: 875.0000 mg | ORAL_TABLET | Freq: Two times a day (BID) | ORAL | 0 refills | Status: DC

## 2022-02-21 NOTE — Patient Instructions (Signed)
Health Maintenance, Male Adopting a healthy lifestyle and getting preventive care are important in promoting health and wellness. Ask your health care provider about: The right schedule for you to have regular tests and exams. Things you can do on your own to prevent diseases and keep yourself healthy. What should I know about diet, weight, and exercise? Eat a healthy diet  Eat a diet that includes plenty of vegetables, fruits, low-fat dairy products, and lean protein. Do not eat a lot of foods that are high in solid fats, added sugars, or sodium. Maintain a healthy weight Body mass index (BMI) is a measurement that can be used to identify possible weight problems. It estimates body fat based on height and weight. Your health care provider can help determine your BMI and help you achieve or maintain a healthy weight. Get regular exercise Get regular exercise. This is one of the most important things you can do for your health. Most adults should: Exercise for at least 150 minutes each week. The exercise should increase your heart rate and make you sweat (moderate-intensity exercise). Do strengthening exercises at least twice a week. This is in addition to the moderate-intensity exercise. Spend less time sitting. Even light physical activity can be beneficial. Watch cholesterol and blood lipids Have your blood tested for lipids and cholesterol at 60 years of age, then have this test every 5 years. You may need to have your cholesterol levels checked more often if: Your lipid or cholesterol levels are high. You are older than 60 years of age. You are at high risk for heart disease. What should I know about cancer screening? Many types of cancers can be detected early and may often be prevented. Depending on your health history and family history, you may need to have cancer screening at various ages. This may include screening for: Colorectal cancer. Prostate cancer. Skin cancer. Lung  cancer. What should I know about heart disease, diabetes, and high blood pressure? Blood pressure and heart disease High blood pressure causes heart disease and increases the risk of stroke. This is more likely to develop in people who have high blood pressure readings or are overweight. Talk with your health care provider about your target blood pressure readings. Have your blood pressure checked: Every 3-5 years if you are 18-39 years of age. Every year if you are 40 years old or older. If you are between the ages of 65 and 75 and are a current or former smoker, ask your health care provider if you should have a one-time screening for abdominal aortic aneurysm (AAA). Diabetes Have regular diabetes screenings. This checks your fasting blood sugar level. Have the screening done: Once every three years after age 45 if you are at a normal weight and have a low risk for diabetes. More often and at a younger age if you are overweight or have a high risk for diabetes. What should I know about preventing infection? Hepatitis B If you have a higher risk for hepatitis B, you should be screened for this virus. Talk with your health care provider to find out if you are at risk for hepatitis B infection. Hepatitis C Blood testing is recommended for: Everyone born from 1945 through 1965. Anyone with known risk factors for hepatitis C. Sexually transmitted infections (STIs) You should be screened each year for STIs, including gonorrhea and chlamydia, if: You are sexually active and are younger than 60 years of age. You are older than 60 years of age and your   health care provider tells you that you are at risk for this type of infection. Your sexual activity has changed since you were last screened, and you are at increased risk for chlamydia or gonorrhea. Ask your health care provider if you are at risk. Ask your health care provider about whether you are at high risk for HIV. Your health care provider  may recommend a prescription medicine to help prevent HIV infection. If you choose to take medicine to prevent HIV, you should first get tested for HIV. You should then be tested every 3 months for as long as you are taking the medicine. Follow these instructions at home: Alcohol use Do not drink alcohol if your health care provider tells you not to drink. If you drink alcohol: Limit how much you have to 0-2 drinks a day. Know how much alcohol is in your drink. In the [place] one drink equals one 12 oz bottle of beer (355 mL), one 5 oz glass of wine (148 mL), or one 1 oz glass of hard liquor (44 mL). Lifestyle Do not use any products that contain nicotine or tobacco. These products include cigarettes, chewing tobacco, and vaping devices, such as e-cigarettes. If you need help quitting, ask your health care provider. Do not use street drugs. Do not share needles. Ask your health care provider for help if you need support or information about quitting drugs. General instructions Schedule regular health, dental, and eye exams. Stay current with your vaccines. Tell your health care provider if: You often feel depressed. You have ever been abused or do not feel safe at home. Summary Adopting a healthy lifestyle and getting preventive care are important in promoting health and wellness. Follow your health care provider's instructions about healthy diet, exercising, and getting tested or screened for diseases. Follow your health care provider's instructions on monitoring your cholesterol and blood pressure. This information is not intended to replace advice given to you by your health care provider. Make sure you discuss any questions you have with your health care provider. Document Revised: 05/31/2020 Document Reviewed: 05/31/2020 [employer] Patient Education  2023 [employer]  

## 2022-02-21 NOTE — Progress Notes (Signed)
I,Tianna Badgett,acting as a Education administrator for [employer], FNP.,have documented all relevant documentation on the behalf of Minette Brine, FNP,as directed by  Minette Brine, FNP while in the presence of Minette Brine, [place].   Subjective:     Patient ID: David Humphrey , male    DOB: 05-04-1960 , 62 y.o.   MRN: [id number]   Chief Complaint  Patient presents with   Annual Exam    HPI  Patient presents today for HM. He does not have any taste or smell for the last 3 weeks after having a cold. He is taking cough syrup and tessalon perles. Continues to get up "phlegm", clear. His cough is still there. He has not been to the eye doctor or colonoscopy      Past Medical History:  Diagnosis Date   Diabetes mellitus without complication ([place])    Hypertension      Family History  Problem Relation Age of Onset   Hypertension Mother    Kidney disease Mother    Cancer Father    Cancer Paternal Grandmother      Current Outpatient Medications:    amLODipine (NORVASC) 5 MG tablet, Take 1 tablet (5 mg total) by mouth daily., Disp: 30 tablet, Rfl: 2   amoxicillin (AMOXIL) 875 MG tablet, Take 1 tablet (875 mg total) by mouth 2 (two) times daily., Disp: 14 tablet, Rfl: 0   atorvastatin (LIPITOR) 10 MG tablet, TAKE 1 TABLET BY MOUTH MONDAY, WEDNESDAY AND FRIDAY, Disp: 36 tablet, Rfl: 1   benzonatate (TESSALON) 100 MG capsule, Take 1 capsule (100 mg total) by mouth 3 (three) times daily as needed for cough., Disp: 21 capsule, Rfl: 0   lisinopril (ZESTRIL) 10 MG tablet, Take 1 tablet (10 mg total) by mouth daily., Disp: 90 tablet, Rfl: 1   promethazine-dextromethorphan (PROMETHAZINE-DM) 6.25-15 MG/5ML syrup, Take 5 mLs by mouth 4 (four) times daily as needed., Disp: 118 mL, Rfl: 0   sitaGLIPtin-metformin (JANUMET) 50-1000 MG tablet, TAKE 1 TABLET BY MOUTH TWICE A DAY WITH A MEAL, Disp: 180 tablet, Rfl: 1   testosterone cypionate (DEPOTESTOTERONE CYPIONATE) 100 MG/ML injection, INJECT 1 ML (100 MG TOTAL)  INTO THE MUSCLE 2 TIMES A WEEK. (DISCARD VIAL 28 DAYS AFTER PUNCTURES), Disp: 10 mL, Rfl: 5   Vitamin D, Ergocalciferol, (DRISDOL) 1.25 MG (50000 UNIT) CAPS capsule, Take 1 capsule (50,000 Units total) by mouth 2 (two) times a week., Disp: 24 capsule, Rfl: 1   Blood Glucose Monitoring Suppl (ONETOUCH VERIO) w/Device KIT, 1 Device by Does not apply route daily. Use as directed to check blood sugars 1 time daily, Disp: 1 kit, Rfl: 0   glucose blood (ONETOUCH VERIO) test strip, Use as instructed to check blood sugars 1 time daily, Disp: 150 each, Rfl: 2   ibuprofen (ADVIL,MOTRIN) 600 MG tablet, Take 1 tablet (600 mg total) by mouth every 6 (six) hours as needed., Disp: 30 tablet, Rfl: 0   NEEDLE, DISP, 25 G (EASY TOUCH FLIPLOCK NEEDLES) 25G X 1" MISC, Use with testosterone 2 times a week, Disp: 50 each, Rfl: 3   sildenafil (VIAGRA) 100 MG tablet, TAKE 1 TABLET BY MOUTH EVERY DAY AS NEEDED FOR ERECTILE DYSFUNCTION, Disp: 6 tablet, Rfl: 2   Suvorexant (BELSOMRA) 10 MG TABS, Take 1 tablet by mouth daily. (Patient not taking: Reported on 02/21/2022), Disp: 30 tablet, Rfl: 2   No Known Allergies   Men's preventive visit. Patient Health Questionnaire (PHQ-2) is  [facility] Visit from 02/21/2022 in [facility]  PHQ-2 Total Score 0      Patient is on a Regular diet - 4 months and has not been eating as healthy and has not been to the gym. He has just started back this morning. He ate a 3 piece fried chicken from [facility]. He also had "oodles of noodles". Marital status: Single. Relevant history for alcohol use is:  Social History   Substance and Sexual Activity  Alcohol Use Yes   Comment: socially  . Relevant history for tobacco use is:  Social History   Tobacco Use  Smoking Status Never  Smokeless Tobacco Never  .   Review of Systems  Constitutional: Negative.   HENT: Negative.    Eyes: Negative.   Respiratory: Negative.    Cardiovascular: Negative.    Gastrointestinal: Negative.   Endocrine: Negative.   Genitourinary: Negative.   Musculoskeletal: Negative.   Skin: Negative.   Allergic/Immunologic: Negative.   Neurological: Negative.   Hematological: Negative.   Psychiatric/Behavioral: Negative.       Today's Vitals   02/21/22 1059  BP: (!) 160/90  Pulse: 76  Temp: 98.7 F (37.1 C)  TempSrc: Oral  SpO2: 97%  Weight: 230 lb 3.2 oz (104.4 kg)  Height: 5' 7.4" (1.712 m)   Body mass index is 35.63 kg/m.   Objective:  Physical Exam Vitals reviewed.  Constitutional:      General: He is not in acute distress.    Appearance: Normal appearance. He is obese. He is not ill-appearing.  HENT:     Head: Normocephalic and atraumatic.     Right Ear: Tympanic membrane, ear canal and external ear normal.     Left Ear: Tympanic membrane, ear canal and external ear normal.     Nose:     Comments: Deferred wearing mask    Mouth/Throat:     Comments: Deferred wearing mask Eyes:     Extraocular Movements: Extraocular movements intact.     Conjunctiva/sclera: Conjunctivae normal.     Pupils: Pupils are equal, round, and reactive to light.  Neck:     Vascular: No carotid bruit.  Cardiovascular:     Rate and Rhythm: Normal rate and regular rhythm.     Pulses: Normal pulses.     Heart sounds: Normal heart sounds. No murmur heard.    Comments: Bruit to right carotid Pulmonary:     Effort: Pulmonary effort is normal. No respiratory distress.     Breath sounds: Normal breath sounds. No wheezing.  Abdominal:     General: Abdomen is flat. Bowel sounds are normal. There is no distension.     Palpations: Abdomen is soft.     Tenderness: There is no abdominal tenderness. There is no guarding or rebound.     Comments: Ventral Hernia present  Genitourinary:    Comments: Deferred at patient request Musculoskeletal:        General: Normal range of motion.     Cervical back: Normal range of motion and neck supple. No tenderness.  Feet:      Right foot:     Skin integrity: Callus and dry skin present.     Toenail Condition: Fungal disease present.    Left foot:     Skin integrity: Callus and dry skin present.     Toenail Condition: Fungal disease present. Lymphadenopathy:     Cervical: No cervical adenopathy.  Skin:    General: Skin is warm and dry.     Capillary Refill: Capillary refill takes less  than 2 seconds.     Findings: No rash.     Comments: Onychomycosis present on bilateral toes   Neurological:     General: No focal deficit present.     Mental Status: He is alert and oriented to person, place, and time.  Psychiatric:        Mood and Affect: Mood normal.        Behavior: Behavior normal.        Thought Content: Thought content normal.        Judgment: Judgment normal.         Assessment And Plan:    1. Encounter for health maintenance examination Behavior modifications discussed and diet history reviewed.   Pt will continue to exercise regularly and modify diet with low GI, plant based foods and decrease intake of processed foods.  Recommend intake of daily multivitamin, Vitamin D, and calcium.  Recommend colonoscopy for preventive screenings, as well as recommend immunizations that include influenza, TDAP, and Shingles  2. Encounter for prostate cancer screening Comments: Declined manual exam, will check PSA - PSA  3. Type 2 diabetes mellitus with hyperglycemia, without long-term current use of insulin (HCC) Comments: Continue current medications.  Tolerating well. He has not had an eye exam, encouraged to go for eye exam.  - Hemoglobin A1c - CMP14+EGFR  4. Hypertensive heart disease without heart failure Comments: Blood pressure is elevated today, will add amlodipine 5 mg.  This may also help his irregular heart rhythm.  Will check electrolytes. - POCT Urinalysis Dipstick (81002) - Microalbumin / Creatinine Urine Ratio - EKG 12-Lead - Lipid panel - amLODipine (NORVASC) 5 MG tablet; Take 1  tablet (5 mg total) by mouth daily.  Dispense: 30 tablet; Refill: 2  5. Vitamin D deficiency Will check vitamin D level and supplement as needed.    Also encouraged to spend 15 minutes in the sun daily.  - VITAMIN D 25 Hydroxy (Vit-D Deficiency, Fractures)  6. Other long term (current) drug therapy - CBC  7. Persistent cough Comments: Has been ongoing for several weeks, will treat with antibiotic. - amoxicillin (AMOXIL) 875 MG tablet; Take 1 tablet (875 mg total) by mouth 2 (two) times daily.  Dispense: 14 tablet; Refill: 0  8. Bruit of right carotid artery Comments: New finding will order a carotid doppler to evaluate further - US Carotid Duplex Bilateral; Future  9. Abnormal EKG Comments: EKG shows bigemeny, denies any chest pain or shortness of breath or palpitations. Will refer to Cardiology  10. Primary insomnia Comments: Continue current medications.  Will hold shingrix today since he is coughing  Patient was given opportunity to ask questions. Patient verbalized understanding of the plan and was able to repeat key elements of the plan. All questions were answered to their satisfaction.   Minette Brine, FNP   I, Minette Brine, FNP, have reviewed all documentation for this visit. The documentation on 02/21/22 for the exam, diagnosis, procedures, and orders are all accurate and complete.   THE PATIENT IS ENCOURAGED TO PRACTICE SOCIAL DISTANCING DUE TO THE COVID-19 PANDEMIC.

## 2022-02-23 LAB — CBC
Hematocrit: 39.5 % (ref 37.5–51.0)
Hemoglobin: 13.2 g/dL (ref 13.0–17.7)
MCH: 29.4 pg (ref 26.6–33.0)
MCHC: 33.4 g/dL (ref 31.5–35.7)
MCV: 88 fL (ref 79–97)
Platelets: 187 10*3/uL (ref 150–450)
RBC: 4.49 x10E6/uL (ref 4.14–5.80)
RDW: 13.1 % (ref 11.6–15.4)
WBC: 4.8 10*3/uL (ref 3.4–10.8)

## 2022-02-23 LAB — CMP14+EGFR
ALT: 25 IU/L (ref 0–44)
AST: 16 IU/L (ref 0–40)
Albumin/Globulin Ratio: 1.6 (ref 1.2–2.2)
Albumin: 4.1 g/dL (ref 3.8–4.9)
Alkaline Phosphatase: 87 IU/L (ref 44–121)
BUN/Creatinine Ratio: 11 (ref 9–20)
BUN: 10 mg/dL (ref 6–24)
Bilirubin Total: 0.3 mg/dL (ref 0.0–1.2)
CO2: 26 mmol/L (ref 20–29)
Calcium: 8.9 mg/dL (ref 8.7–10.2)
Chloride: 105 mmol/L (ref 96–106)
Creatinine, Ser: 0.91 mg/dL (ref 0.76–1.27)
Globulin, Total: 2.6 g/dL (ref 1.5–4.5)
Glucose: 174 mg/dL — ABNORMAL HIGH (ref 70–99)
Potassium: 4.1 mmol/L (ref 3.5–5.2)
Sodium: 144 mmol/L (ref 134–144)
Total Protein: 6.7 g/dL (ref 6.0–8.5)
eGFR: 97 mL/min/{1.73_m2} (ref >59)

## 2022-02-23 LAB — LIPID PANEL
Chol/HDL Ratio: 2.8 ratio (ref 0.0–5.0)
Cholesterol, Total: 136 mg/dL (ref 100–199)
HDL: 49 mg/dL (ref >39)
LDL Chol Calc (NIH): 71 mg/dL (ref 0–99)
Triglycerides: 81 mg/dL (ref 0–149)
VLDL Cholesterol Cal: 16 mg/dL (ref 5–40)

## 2022-02-23 LAB — MICROALBUMIN / CREATININE URINE RATIO
Creatinine, Urine: 233.8 mg/dL
Microalb/Creat Ratio: 12 mg/g creat (ref 0–29)
Microalbumin, Urine: 28.9 ug/mL

## 2022-02-23 LAB — HEMOGLOBIN A1C
Est. average glucose Bld gHb Est-mCnc: 203 mg/dL
Hgb A1c MFr Bld: 8.7 % — ABNORMAL HIGH (ref 4.8–5.6)

## 2022-02-23 LAB — PSA: Prostate Specific Ag, Serum: 0.9 ng/mL (ref 0.0–4.0)

## 2022-02-23 LAB — VITAMIN D 25 HYDROXY (VIT D DEFICIENCY, FRACTURES): Vit D, 25-Hydroxy: 26.6 ng/mL — ABNORMAL LOW (ref 30.0–100.0)

## 2022-02-27 ENCOUNTER — Other Ambulatory Visit: Payer: Self-pay

## 2022-02-27 DIAGNOSIS — I498 Other specified cardiac arrhythmias: Secondary | ICD-10-CM

## 2022-02-27 MED ORDER — AMLODIPINE BESYLATE 5 MG PO TABS: 5.0000 mg | ORAL_TABLET | Freq: Every day | ORAL | 2 refills | Status: DC

## 2022-02-28 ENCOUNTER — Other Ambulatory Visit: Payer: Self-pay | Admitting: Nurse Practitioner

## 2022-02-28 ENCOUNTER — Ambulatory Visit
Admission: RE | Admit: 2022-02-28 | Discharge: 2022-02-28 | Disposition: A | Discharge status: Home / Self Care | Payer: BC Managed Care – PPO | Source: Ambulatory Visit | Attending: Nurse Practitioner | Admitting: Nurse Practitioner

## 2022-02-28 DIAGNOSIS — I6523 Occlusion and stenosis of bilateral carotid arteries: Secondary | ICD-10-CM | POA: Diagnosis not present

## 2022-02-28 DIAGNOSIS — R0989 Other specified symptoms and signs involving the circulatory and respiratory systems: Secondary | ICD-10-CM

## 2022-02-28 DIAGNOSIS — E041 Nontoxic single thyroid nodule: Secondary | ICD-10-CM | POA: Diagnosis not present

## 2022-03-03 ENCOUNTER — Encounter: Payer: Self-pay | Admitting: Cardiovascular Disease

## 2022-03-03 ENCOUNTER — Ambulatory Visit: Payer: BC Managed Care – PPO | Attending: Cardiovascular Disease | Admitting: Cardiovascular Disease

## 2022-03-03 ENCOUNTER — Ambulatory Visit: Payer: BC Managed Care – PPO | Attending: Cardiovascular Disease

## 2022-03-03 VITALS — BP 158/70 | HR 84 | Ht 69.0 in | Wt 229.0 lb

## 2022-03-03 DIAGNOSIS — I493 Ventricular premature depolarization: Secondary | ICD-10-CM

## 2022-03-03 DIAGNOSIS — R9431 Abnormal electrocardiogram [ECG] [EKG]: Secondary | ICD-10-CM

## 2022-03-03 DIAGNOSIS — N529 Male erectile dysfunction, unspecified: Secondary | ICD-10-CM

## 2022-03-03 DIAGNOSIS — E119 Type 2 diabetes mellitus without complications: Secondary | ICD-10-CM

## 2022-03-03 DIAGNOSIS — E7800 Pure hypercholesterolemia, unspecified: Secondary | ICD-10-CM

## 2022-03-03 DIAGNOSIS — I10 Essential (primary) hypertension: Secondary | ICD-10-CM

## 2022-03-03 DIAGNOSIS — R0683 Snoring: Secondary | ICD-10-CM

## 2022-03-03 MED ORDER — CARVEDILOL 3.125 MG PO TABS: 3.1250 mg | ORAL_TABLET | Freq: Two times a day (BID) | ORAL | 3 refills | Status: DC

## 2022-03-03 NOTE — Patient Instructions (Addendum)
Medication Instructions:  START carvedilol 3.130m twice daily  *If you need a refill on your cardiac medications before your next appointment, please call your pharmacy*  Testing/Procedures: Your physician has requested that you have an echocardiogram. Echocardiography is a painless test that uses sound waves to create images of your heart. It provides your doctor with information about the size and shape of your heart and how well your heart's chambers and valves are working. This procedure takes approximately one hour. There are no restrictions for this procedure. Please do NOT wear cologne, perfume, aftershave, or lotions (deodorant is allowed). Please arrive 15 minutes prior to your appointment time.  ZIO XT- Long Term Monitor Instructions  Your physician has requested you wear a ZIO patch monitor for 3 days.  This is a single patch monitor. [employer] supplies one patch monitor per enrollment. Additional stickers are not available. Please do not apply patch if you will be having a Nuclear Stress Test,  Echocardiogram, Cardiac CT, MRI, or Chest Xray during the period you would be wearing the  monitor. The patch cannot be worn during these tests. You cannot remove and re-apply the  ZIO XT patch monitor.  Your ZIO patch monitor will be mailed 3 day USPS to your address on file. It may take 3-5 days  to receive your monitor after you have been enrolled.  Once you have received your monitor, please review the enclosed instructions. Your monitor  has already been registered assigning a specific monitor serial # to you.  Billing and Patient Assistance Program Information  We have supplied [employer] with any of your insurance information on file for billing purposes. [employer] offers a sliding scale Patient Assistance Program for patients that do not have  insurance, or whose insurance does not completely cover the cost of the ZIO monitor.  You must apply for the Patient Assistance Program to  qualify for this discounted rate.  To apply, please call [employer] at [phone] select option 4, select option 2, ask to apply for  Patient Assistance Program. ITheodore Demarkwill ask your household income, and how many people  are in your household. They will quote your out-of-pocket cost based on that information.  [employer] will also be able to set up a 1109-monthinterest-free payment plan if needed.  Applying the monitor   Shave hair from upper left chest.  Hold abrader disc by orange tab. Rub abrader in 40 strokes over the upper left chest as  indicated in your monitor instructions.  Clean area with 4 enclosed alcohol pads. Let dry.  Apply patch as indicated in monitor instructions. Patch will be placed under collarbone on left  side of chest with arrow pointing upward.  Rub patch adhesive wings for 2 minutes. Remove white label marked "1". Remove the white  label marked "2". Rub patch adhesive wings for 2 additional minutes.  While looking in a mirror, press and release button in center of patch. A small green light will  flash 3-4 times. This will be your only indicator that the monitor has been turned on.  Do not shower for the first 24 hours. You may shower after the first 24 hours.  Press the button if you feel a symptom. You will hear a small click. Record Date, Time and  Symptom in the Patient Logbook.  When you are ready to remove the patch, follow instructions on the last 2 pages of Patient  Logbook. Stick patch monitor onto the last page of Patient Logbook.  Place Patient Logbook  in the blue and white box. Use locking tab on box and tape box closed  securely. The blue and white box has prepaid postage on it. Please place it in the mailbox as  soon as possible. Your physician should have your test results approximately 7 days after the  monitor has been mailed back to [facility].  Call [facility] at [phone] if you have questions regarding  your ZIO XT  patch monitor. Call them immediately if you see an orange light blinking on your  monitor.  If your monitor falls off in less than 4 days, contact our Monitor department at [phone].  If your monitor becomes loose or falls off after 4 days call [employer] at [phone] for  suggestions on securing your monitor   WatchPAT?  Is a FDA cleared portable home sleep study test that uses a watch and 3 points of contact to monitor 7 different channels, including your heart rate, oxygen saturations, body position, snoring, and chest motion.  The study is easy to use from the comfort of your own home and accurately detect sleep apnea.  Before bed, you attach the chest sensor, attached the sleep apnea bracelet to your nondominant hand, and attach the finger probe.  After the study, the raw data is downloaded from the watch and scored for apnea events.   For more information: [email or web address]  Patient Testing Instructions:  Do not put battery into the device until bedtime when you are ready to begin the test. Please call the support number if you need assistance after following the instructions below: 24 hour support line- [phone] or [facility] support at [phone] (option 2)  Download the [employer] AmericanWatchPAT One" app through the google play store or App Store  Be sure to turn on or enable access to bluetooth in settlings on your smartphone/ device  Make sure no other bluetooth devices are on and within the vicinity of your smartphone/ device and WatchPAT watch during testing.  Make sure to leave your smart phone/ device plugged in and charging all night.  When ready for bed:  Follow the instructions step by step in the WatchPAT One App to activate the testing device. For additional instructions, including video instruction, visit the WatchPAT One video on Youtube. You can search for [place] One within Youtube (video is 4 minutes and 18 seconds) or enter:  https://youtube/watch?[email or web address]_vbiwxE Please note: You will be prompted to enter a Pin to connect via bluetooth when starting the test. The PIN will be assigned to you when you receive the test.  The device is disposable, but it recommended that you retain the device until you receive a call letting you know the study has been received and the results have been interpreted.  We will let you know if the study did not transmit to [place] properly after the test is completed. You do not need to call us to confirm the receipt of the test.  Please complete the test within 48 hours of receiving PIN.   Frequently Asked Questions:  What is Watch [place] one?  A single use fully disposable home sleep apnea testing device and will not need to be returned after completion.  What are the requirements to use WatchPAT one?  The be able to have a successful watchpat one sleep study, you should have your Watch pat one device, your smart phone, watch pat one app, your PIN number and Internet access What type of phone do I need?  You should have a smart phone  that uses Android 5.1 and above or any Iphone with IOS 10 and above How can I download the WatchPAT one app?  Based on your device type search for WatchPAT one app either in google play for android devices or APP store for Iphone's Where will I get my PIN for the study?  Your PIN will be provided by your physician's office. It is used for authentication and if you lose/forget your PIN, please reach out to your providers office.  I do not have Internet at home. Can I do WatchPAT one study?  WatchPAT One needs Internet connection throughout the night to be able to transmit the sleep data. You can use your home/local internet or your cellular's data package. However, it is always recommended to use home/local Internet. It is estimated that between 20MB-30MB will be used with each study.However, the application will be looking for 80MB space in the phone to start the study.   What happens if I lose internet or bluetooth connection?  During the internet disconnection, your phone will not be able to transmit the sleep data. All the data, will be stored in your phone. As soon as the internet connection is back on, the phone will being sending the sleep data. During the bluetooth disconnection, WatchPAT one will not be able to to send the sleep data to your phone. Data will be kept in the [facility] one until two devices have bluetooth connection back on. As soon as the connection is back on, WatchPAT one will send the sleep data to the phone.  How long do I need to wear the WatchPAT one?  After you start the study, you should wear the device at least 6 hours.  How far should I keep my phone from the device?  During the night, your phone should be within 15 feet.  What happens if I leave the room for restroom or other reasons?  Leaving the room for any reason will not cause any problem. As soon as your get back to the room, both devices will reconnect and will continue to send the sleep data. Can I use my phone during the sleep study?  Yes, you can use your phone as usual during the study. But it is recommended to put your watchpat one on when you are ready to go to bed.  How will I get my study results?  A soon as you completed your study, your sleep data will be sent to the provider. They will then share the results with you when they are ready.     Follow-Up: At [facility], you and your health needs are our priority.  As part of our continuing mission to provide you with exceptional heart care, we have created designated Provider Care Teams.  These Care Teams include your primary Cardiologist (physician) and Advanced Practice Providers (APPs -  Physician Assistants and Nurse Practitioners) who all work together to provide you with the care you need, when you need it.  We recommend signing up for the patient portal called "MyChart".  Sign up information is  provided on this After Visit Summary.  MyChart is used to connect with patients for Virtual Visits (Telemedicine).  Patients are able to view lab/test results, encounter notes, upcoming appointments, etc.  Non-urgent messages can be sent to your provider as well.   To learn more about what you can do with MyChart, go to [email or web address].    Your next appointment:   3 month(s)  Provider:   Dr.  Croitoru

## 2022-03-03 NOTE — Progress Notes (Unsigned)
Enrolled patient for a 3 day Zio XT monitor to be mailed to patients home. Patient to wear 1-3 days

## 2022-03-03 NOTE — Progress Notes (Signed)
Cardiology Office Note:    Date:  03/03/2022   ID:  David Humphrey, DOB 10-Jun-1960, MRN [id number]  PCP:  Minette Brine, [place] Providers Cardiologist:  None     Referring MD: Minette Brine, FNP   Chief Complaint  Patient presents with   Consult  David Humphrey is a 62 y.o. male who is being seen today for the evaluation of arrhythmia at the request of Minette Brine, [place].   History of Present Illness:    David Humphrey is a 62 y.o. male with a hx of type 2 DM, HTN, HLP without a previous known history of structural heart disease, CAD or PAD who was found to have very frequent premature ventricular contractions during a physical examination with his primary care provider.  He was in great shape about a year ago, when he was exercising regularly and eating a healthy diet in order to be, as fit as possible for his daughter's wedding.  After the wedding, he is essentially stopped exercising for at least the last 4 months and has been less careful with his diet, leading to deterioration in his glucose and blood pressure control.  He has gained about 10 pounds since last summer.  In early January he had an upper respiratory tract infection that was probably viral and associated with headache, decreased taste and decreased smell and a protracted cough.  Only now is he starting to regain his sense of smell and taste and the cough is subsided.  His blood pressure was elevated during that episode.  During the subsequent physical exam with his primary care provider on 02/21/2022, he was found to have elevated blood pressure at 160/90 mmHg and an ECG showed very frequent premature ventricular contractions, of which he was unaware.  He was found to have a right carotid bruit but a follow-up ultrasound shows only minimal homogenous carotid plaque without stenosis.  He was incidentally found to have a right thyroid nodule measuring 2.8 cm.  He denies palpitations or dizziness or  syncope.  He has not noticed shortness of breath or chest pain either at rest or with activity.  He denies orthopnea, PND, lower extremity edema, claudication or focal neurological events.  He does feel that his stamina has diminished compared to last year.  He has erectile dysfunction which responds well to sildenafil 100 mg.  He is a loud snorer, occasionally wakes himself up snoring, especially when he falls asleep sitting in a recliner.  He has not been told that he has episodes of apnea.  He generally does wake up feeling refreshed and does not have a lot in the way of daytime hypersomnolence.  Occasionally he takes an afternoon nap, but he can watch her gain more finish a movie without falling asleep.  Glycemic control has deteriorated with a recent hemoglobin A1c of 8.7%.  His lipid profile is pretty good with an LDL of 71 and HDL of 49 on treatment with atorvastatin.  Past Medical History:  Diagnosis Date   Diabetes mellitus without complication ([place])    Hypertension     History reviewed. No pertinent surgical history.  Current Medications: Current Meds  Medication Sig   amLODipine (NORVASC) 5 MG tablet Take 1 tablet (5 mg total) by mouth daily.   amoxicillin (AMOXIL) 875 MG tablet Take 1 tablet (875 mg total) by mouth 2 (two) times daily.   atorvastatin (LIPITOR) 10 MG tablet TAKE 1 TABLET BY MOUTH MONDAY, WEDNESDAY AND FRIDAY  carvedilol (COREG) 3.125 MG tablet Take 1 tablet (3.125 mg total) by mouth 2 (two) times daily with a meal.   lisinopril (ZESTRIL) 10 MG tablet Take 1 tablet (10 mg total) by mouth daily.   sitaGLIPtin-metformin (JANUMET) 50-1000 MG tablet TAKE 1 TABLET BY MOUTH TWICE A DAY WITH A MEAL     Allergies:   Patient has no known allergies.   Social History   Socioeconomic History   Marital status: Single    Spouse name: Not on file   Number of children: Not on file   Years of education: Not on file   Highest education level: Not on file  Occupational  History   Not on file  Tobacco Use   Smoking status: Never   Smokeless tobacco: Never  Vaping Use   Vaping Use: Never used  Substance and Sexual Activity   Alcohol use: Yes    Comment: socially   Drug use: No   Sexual activity: Yes  Other Topics Concern   Not on file  Social History Narrative   Not on file   Social Determinants of Health   Financial Resource Strain: Not on file  Food Insecurity: Not on file  Transportation Needs: Not on file  Physical Activity: Not on file  Stress: Not on file  Social Connections: Not on file     Family History: The patient's family history includes Cancer in his father and paternal grandmother; Hypertension in his mother; Kidney disease in his mother.  ROS:   Please see the history of present illness.     All other systems reviewed and are negative.  EKGs/Labs/Other Studies Reviewed:    The following studies were reviewed today: Notes from PCP, ECG from their office, recent labs.  EKG:  EKG is  ordered today.  The ekg ordered today demonstrates sinus rhythm with frequent PVCs (5 are seen on the 10-second tracing), early RS transition with dominant R in lead V2 but otherwise a fairly normal tracing.  Borderline QTc 479 ms.  The PVCs all have similar morphology (left bundle branch block, normal axis, RS transition in V3).  Recent Labs: 02/21/2022: ALT 25; BUN 10; Creatinine, Ser 0.91; Hemoglobin 13.2; Platelets 187; Potassium 4.1; Sodium 144  Recent Lipid Panel    Component Value Date/Time   CHOL 136 02/21/2022 1214   TRIG 81 02/21/2022 1214   HDL 49 02/21/2022 1214   CHOLHDL 2.8 02/21/2022 1214   LDLCALC 71 02/21/2022 1214     Risk Assessment/Calculations:      HYPERTENSION CONTROL Vitals:   03/03/22 1305 03/03/22 1321  BP: (!) 168/70 (!) 158/70    The patient's blood pressure is elevated above target today.  In order to address the patient's elevated BP: A new medication was prescribed today.       STOP-Bang Score:   5       Physical Exam:    VS:  BP (!) 158/70   Pulse 84   Ht 5' 9"$  (1.753 m)   Wt 229 lb (103.9 kg)   SpO2 98%   BMI 33.82 kg/m     Wt Readings from Last 3 Encounters:  03/03/22 229 lb (103.9 kg)  02/21/22 230 lb 3.2 oz (104.4 kg)  01/26/22 215 lb (97.5 kg)     GEN: Moderately obese, well nourished, well developed in no acute distress HEENT: Normal NECK: No JVD; left carotid bruit is present LYMPHATICS: No lymphadenopathy CARDIAC: Frequent ectopy on a background of RRR, no murmurs, rubs, gallops RESPIRATORY:  Clear to auscultation without rales, wheezing or rhonchi  ABDOMEN: Soft, non-tender, non-distended MUSCULOSKELETAL:  No edema; No deformity  SKIN: Warm and dry NEUROLOGIC:  Alert and oriented x 3 PSYCHIATRIC:  Normal affect   ASSESSMENT:    1. PVC's (premature ventricular contractions)   2. Nonspecific abnormal electrocardiogram (ECG) (EKG)   3. Essential hypertension   4. Snoring   5. Type 2 diabetes mellitus without complication, without long-term current use of insulin ([place])   6. Hypercholesterolemia   7. Erectile dysfunction, unspecified erectile dysfunction type    PLAN:    In order of problems listed above:  PVCs: These are very frequent, but are asymptomatic.  Important to establish whether he has structural heart disease.  Will start with an echocardiogram and if there is no major structural abnormality we will follow this up with a treadmill stress test to see if the arrhythmia is worsened by exercise.  Will also get a monitor to quantify the frequency of PVCs and make sure he is not having ventricular tachycardia.  Meanwhile I think he can continue light-moderate exercise such as walking/elliptical/bicycle etc., but would avoid weight lifting until we clarify whether he has any structural heart disease Abnormal ECG: His cardiovascular exam is fairly normal except for the arrhythmia, but the prominent R wave in lead V2 raises the possibility of right  ventricular hypertrophy or old posterior infarction.  Either 1 of these could be the substrate for his PVCs which seem to originate in the right ventricle. HTN: His blood pressure is poorly controlled and this can be at least partly attributed to weight gain and reduced activity level.  Will add carvedilol to see if this helps suppress his PVCs as well as control of blood pressure.  Need to monitor for worsening erectile dysfunction which may lead to a change in plans.  Preferable to keep him on an ACE inhibitor due to his diagnosis of diabetes, but if the beta-blocker works well for him we may increase the dose and stop his amlodipine. Snoring/hypersomnolence: Many of his symptoms suggest obstructive sleep apnea and he has the phenotype for this, but he does not have severe daytime hypersomnolence.  Scheduled for home sleep study.  Encouraged weight loss. DM: Discussed ways to improve his diet by reducing the overall intake of calories, especially carbohydrate calories and we reviewed the concept of the glycemic index.  Avoid sweets, sugary drinks, starches with high glycemic index.  Currently on Sitagliptin-metformin.  If lifestyle changes do not improve his glucose levels, would opt to use an SGLT2 inhibitor as a neck step. HLP: His lipid profile looks pretty good on a low-dose of atorvastatin.  Continue. Erectile dysfunction: Could be worsened by beta-blocker.  Will have to review this at follow-up.           Medication Adjustments/Labs and Tests Ordered: Current medicines are reviewed at length with the patient today.  Concerns regarding medicines are outlined above.  Orders Placed This Encounter  Procedures   LONG TERM MONITOR (3-14 DAYS)   EKG 12-Lead   ECHOCARDIOGRAM COMPLETE   Itamar Sleep Study   Meds ordered this encounter  Medications   carvedilol (COREG) 3.125 MG tablet    Sig: Take 1 tablet (3.125 mg total) by mouth 2 (two) times daily with a meal.    Dispense:  180 tablet     Refill:  3    Patient Instructions  Medication Instructions:  START carvedilol 3.11m twice daily  *If you need a refill on your  cardiac medications before your next appointment, please call your pharmacy*  Testing/Procedures: Your physician has requested that you have an echocardiogram. Echocardiography is a painless test that uses sound waves to create images of your heart. It provides your doctor with information about the size and shape of your heart and how well your heart's chambers and valves are working. This procedure takes approximately one hour. There are no restrictions for this procedure. Please do NOT wear cologne, perfume, aftershave, or lotions (deodorant is allowed). Please arrive 15 minutes prior to your appointment time.  ZIO XT- Long Term Monitor Instructions  Your physician has requested you wear a ZIO patch monitor for 3 days.  This is a single patch monitor. [employer] supplies one patch monitor per enrollment. Additional stickers are not available. Please do not apply patch if you will be having a Nuclear Stress Test,  Echocardiogram, Cardiac CT, MRI, or Chest Xray during the period you would be wearing the  monitor. The patch cannot be worn during these tests. You cannot remove and re-apply the  ZIO XT patch monitor.  Your ZIO patch monitor will be mailed 3 day USPS to your address on file. It may take 3-5 days  to receive your monitor after you have been enrolled.  Once you have received your monitor, please review the enclosed instructions. Your monitor  has already been registered assigning a specific monitor serial # to you.  Billing and Patient Assistance Program Information  We have supplied [employer] with any of your insurance information on file for billing purposes. [employer] offers a sliding scale Patient Assistance Program for patients that do not have  insurance, or whose insurance does not completely cover the cost of the ZIO monitor.  You must apply  for the Patient Assistance Program to qualify for this discounted rate.  To apply, please call [employer] at [phone], select option 4, select option 2, ask to apply for  Patient Assistance Program. Theodore Demark will ask your household income, and how many people  are in your household. They will quote your out-of-pocket cost based on that information.  [employer] will also be able to set up a 70-month interest-free payment plan if needed.  Applying the monitor   Shave hair from upper left chest.  Hold abrader disc by orange tab. Rub abrader in 40 strokes over the upper left chest as  indicated in your monitor instructions.  Clean area with 4 enclosed alcohol pads. Let dry.  Apply patch as indicated in monitor instructions. Patch will be placed under collarbone on left  side of chest with arrow pointing upward.  Rub patch adhesive wings for 2 minutes. Remove white label marked "1". Remove the white  label marked "2". Rub patch adhesive wings for 2 additional minutes.  While looking in a mirror, press and release button in center of patch. A small green light will  flash 3-4 times. This will be your only indicator that the monitor has been turned on.  Do not shower for the first 24 hours. You may shower after the first 24 hours.  Press the button if you feel a symptom. You will hear a small click. Record Date, Time and  Symptom in the Patient Logbook.  When you are ready to remove the patch, follow instructions on the last 2 pages of Patient  Logbook. Stick patch monitor onto the last page of Patient Logbook.  Place Patient Logbook in the blue and white box. Use locking tab on box and tape box closed  securely. The blue and white box has prepaid postage on it. Please place it in the mailbox as  soon as possible. Your physician should have your test results approximately 7 days after the  monitor has been mailed back to [facility].  Call [place] at [phone] if you  have questions regarding  your ZIO XT patch monitor. Call them immediately if you see an orange light blinking on your  monitor.  If your monitor falls off in less than 4 days, contact our Monitor department at [phone].  If your monitor becomes loose or falls off after 4 days call [employer] at [phone] for  suggestions on securing your monitor   WatchPAT?  Is a FDA cleared portable home sleep study test that uses a watch and 3 points of contact to monitor 7 different channels, including your heart rate, oxygen saturations, body position, snoring, and chest motion.  The study is easy to use from the comfort of your own home and accurately detect sleep apnea.  Before bed, you attach the chest sensor, attached the sleep apnea bracelet to your nondominant hand, and attach the finger probe.  After the study, the raw data is downloaded from the watch and scored for apnea events.   For more information: [email or web address]  Patient Testing Instructions:  Do not put battery into the device until bedtime when you are ready to begin the test. Please call the support number if you need assistance after following the instructions below: 24 hour support line- [phone] or ITAMAR support at [phone] (option 2)  Download the [employer] AmericanWatchPAT One" app through the google play store or App Store  Be sure to turn on or enable access to bluetooth in [place] on your smartphone/ device  Make sure no other bluetooth devices are on and within the vicinity of your smartphone/ device and WatchPAT watch during testing.  Make sure to leave your smart phone/ device plugged in and charging all night.  When ready for bed:  Follow the instructions step by step in the WatchPAT One App to activate the testing device. For additional instructions, including video instruction, visit the WatchPAT One video on Youtube. You can search for [place] One within Youtube (video is 4 minutes and 18  seconds) or enter: https://youtube/watch?v=BCce_vbiwxE Please note: You will be prompted to enter a Pin to connect via bluetooth when starting the test. The PIN will be assigned to you when you receive the test.  The device is disposable, but it recommended that you retain the device until you receive a call letting you know the study has been received and the results have been interpreted.  We will let you know if the study did not transmit to [place] properly after the test is completed. You do not need to call us to confirm the receipt of the test.  Please complete the test within 48 hours of receiving PIN.   Frequently Asked Questions:  What is Watch [place] one?  A single use fully disposable home sleep apnea testing device and will not need to be returned after completion.  What are the requirements to use WatchPAT one?  The be able to have a successful watchpat one sleep study, you should have your Watch pat one device, your smart phone, watch pat one app, your PIN number and Internet access What type of phone do I need?  You should have a smart phone that uses Android 5.1 and above or any Iphone with IOS 10 and above How can  I download the WatchPAT one app?  Based on your device type search for WatchPAT one app either in google play for android devices or APP store for Iphone's Where will I get my PIN for the study?  Your PIN will be provided by your physician's office. It is used for authentication and if you lose/forget your PIN, please reach out to your providers office.  I do not have Internet at home. Can I do WatchPAT one study?  WatchPAT One needs Internet connection throughout the night to be able to transmit the sleep data. You can use your home/local internet or your cellular's data package. However, it is always recommended to use home/local Internet. It is estimated that between 20MB-30MB will be used with each study.However, the application will be looking for 80MB space in the phone to  start the study.  What happens if I lose internet or bluetooth connection?  During the internet disconnection, your phone will not be able to transmit the sleep data. All the data, will be stored in your phone. As soon as the internet connection is back on, the phone will being sending the sleep data. During the bluetooth disconnection, WatchPAT one will not be able to to send the sleep data to your phone. Data will be kept in the [facility] one until two devices have bluetooth connection back on. As soon as the connection is back on, WatchPAT one will send the sleep data to the phone.  How long do I need to wear the WatchPAT one?  After you start the study, you should wear the device at least 6 hours.  How far should I keep my phone from the device?  During the night, your phone should be within 15 feet.  What happens if I leave the room for restroom or other reasons?  Leaving the room for any reason will not cause any problem. As soon as your get back to the room, both devices will reconnect and will continue to send the sleep data. Can I use my phone during the sleep study?  Yes, you can use your phone as usual during the study. But it is recommended to put your watchpat one on when you are ready to go to bed.  How will I get my study results?  A soon as you completed your study, your sleep data will be sent to the provider. They will then share the results with you when they are ready.     Follow-Up: At [facility], you and your health needs are our priority.  As part of our continuing mission to provide you with exceptional heart care, we have created designated Provider Care Teams.  These Care Teams include your primary Cardiologist (physician) and Advanced Practice Providers (APPs -  Physician Assistants and Nurse Practitioners) who all work together to provide you with the care you need, when you need it.  We recommend signing up for the patient portal called "MyChart".  Sign up  information is provided on this After Visit Summary.  MyChart is used to connect with patients for Virtual Visits (Telemedicine).  Patients are able to view lab/test results, encounter notes, upcoming appointments, etc.  Non-urgent messages can be sent to your provider as well.   To learn more about what you can do with MyChart, go to [email or web address].    Your next appointment:   3 month(s)  Provider:   Dr. Sallyanne Kuster    Signed, Sanda Klein, MD  03/03/2022 8:29 PM    [facility]

## 2022-03-08 DIAGNOSIS — I493 Ventricular premature depolarization: Secondary | ICD-10-CM | POA: Diagnosis not present

## 2022-03-08 DIAGNOSIS — R9431 Abnormal electrocardiogram [ECG] [EKG]: Secondary | ICD-10-CM

## 2022-03-09 ENCOUNTER — Ambulatory Visit
Admission: RE | Admit: 2022-03-09 | Discharge: 2022-03-09 | Disposition: A | Discharge status: Home / Self Care | Payer: BC Managed Care – PPO | Source: Ambulatory Visit | Attending: Nurse Practitioner | Admitting: Nurse Practitioner

## 2022-03-09 DIAGNOSIS — E041 Nontoxic single thyroid nodule: Secondary | ICD-10-CM | POA: Diagnosis not present

## 2022-03-10 ENCOUNTER — Ambulatory Visit: Payer: BC Managed Care – PPO

## 2022-03-11 IMAGING — CT CT ABD-PELV W/O CM
1 of 2 series · 14 of 32 positions shown, 19 images · non-contrast
Comparison: Ultrasound 08/25/2019

CLINICAL DATA: Abdominal protrusion, possible mass

EXAM:
CT ABDOMEN AND PELVIS WITHOUT CONTRAST
TECHNIQUE: Multidetector CT imaging of the abdomen and pelvis was performed
following the standard protocol without IV contrast.

[Series 2: abd/pelvis w/(date) · axial · 0.86mm/px · z∈[-488,-73]mm · 14 of 95 slices shown, 19 images]
[im 6/95  soft-tissue]
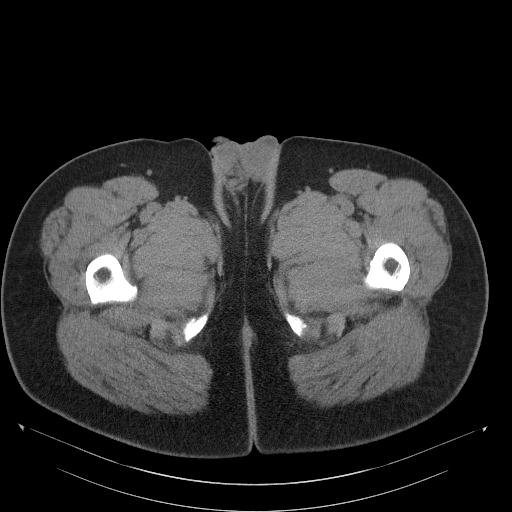
[im 6/95  bone]
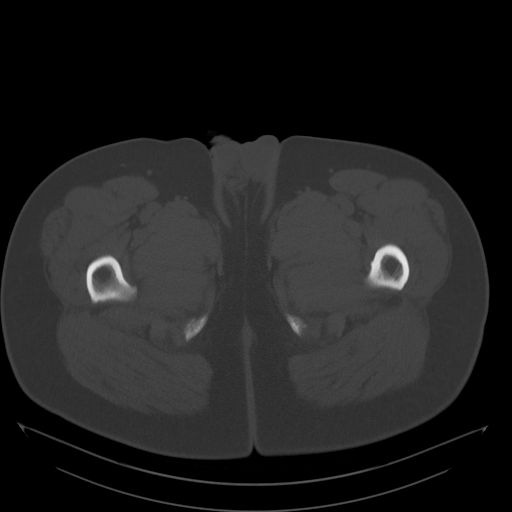
[im 12/95  soft-tissue]
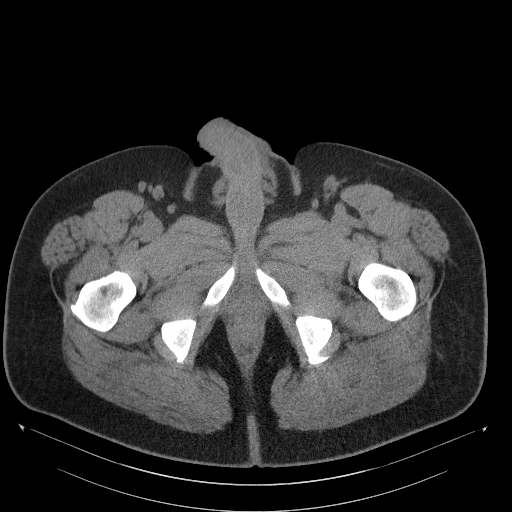
[im 18/95  soft-tissue]
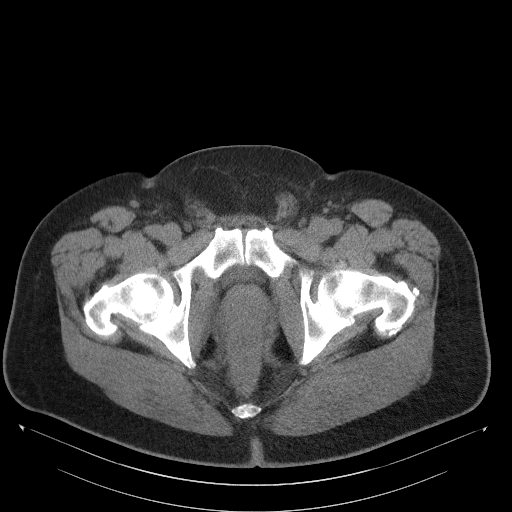
[im 30/95  soft-tissue]
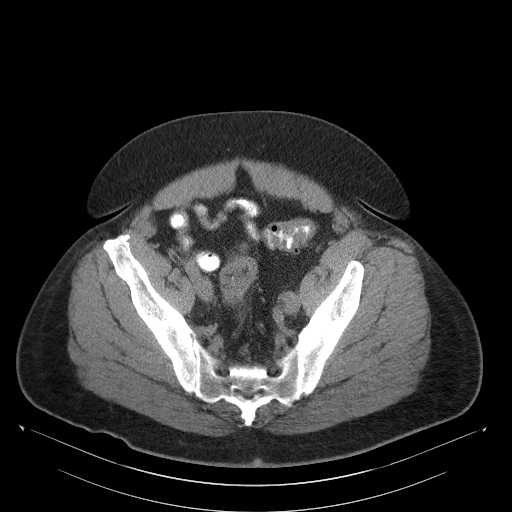
[im 36/95  soft-tissue]
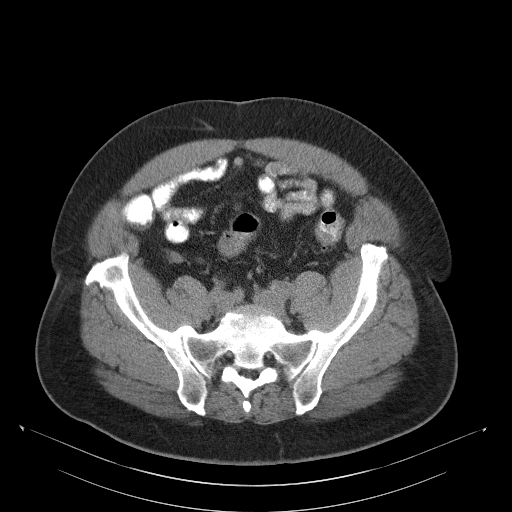
[im 42/95  soft-tissue]
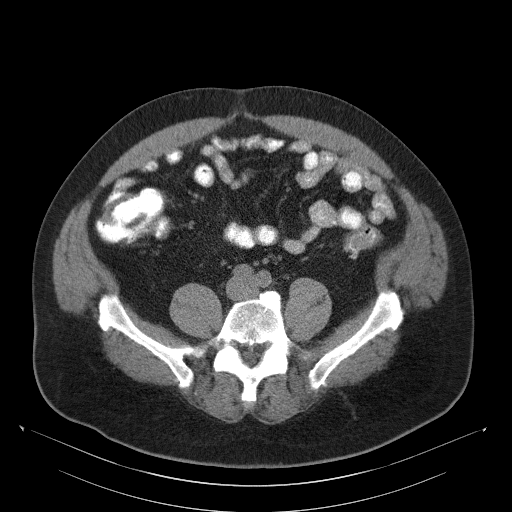
[im 48/95  soft-tissue]
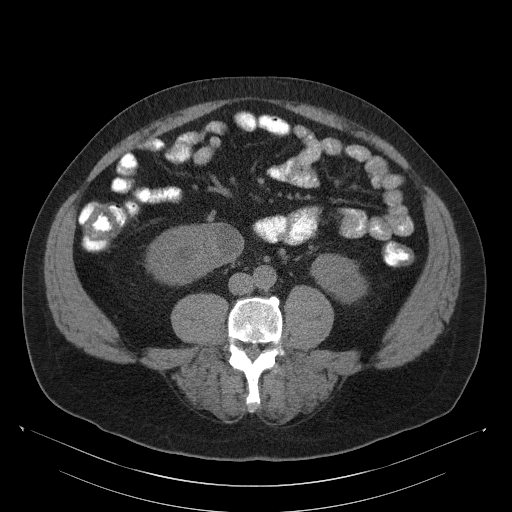
[im 53/95  soft-tissue]
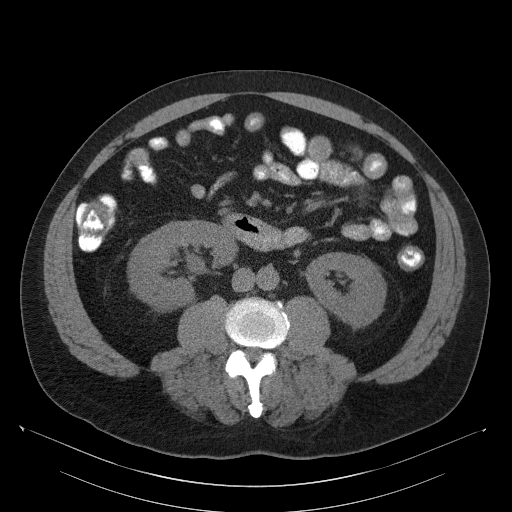
[im 59/95  soft-tissue]
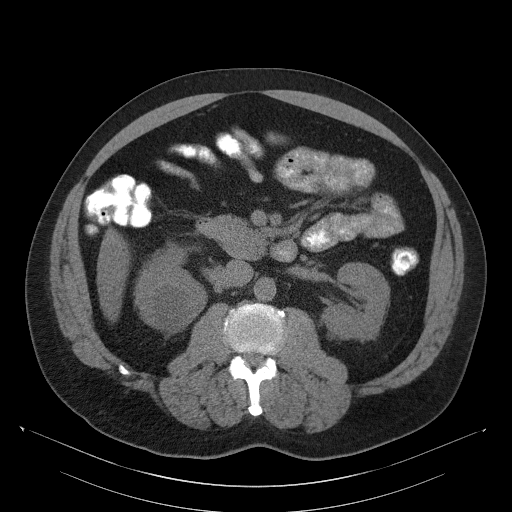
[im 59/95  bone]
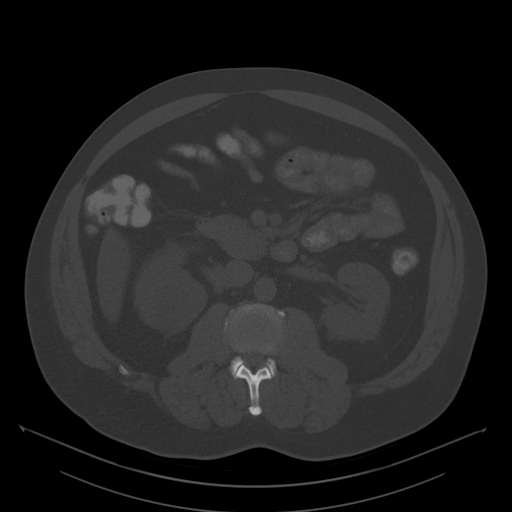
[im 65/95  soft-tissue]
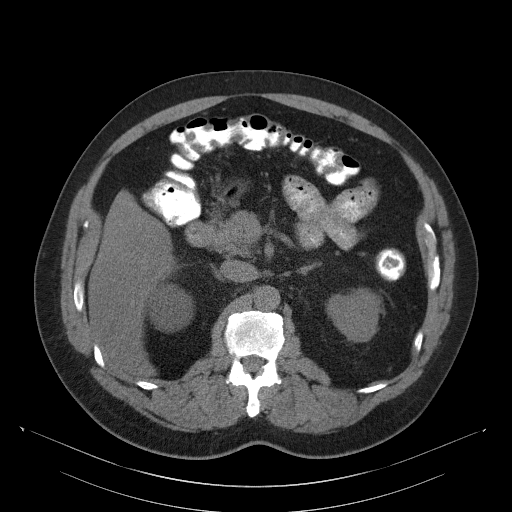
[im 71/95  lung]
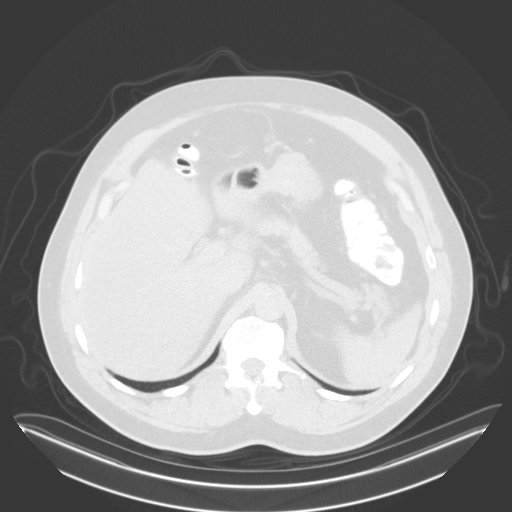
[im 77/95  soft-tissue]
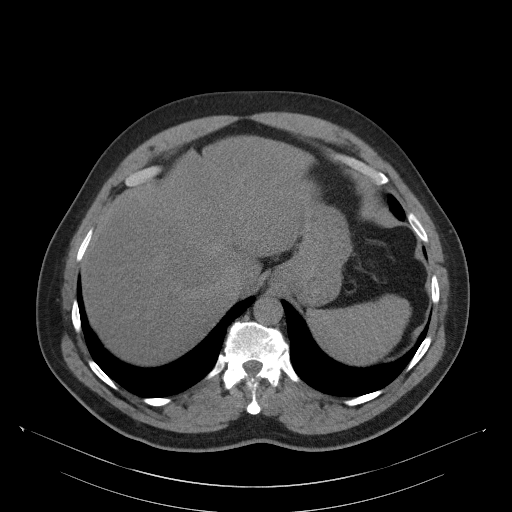
[im 77/95  lung]
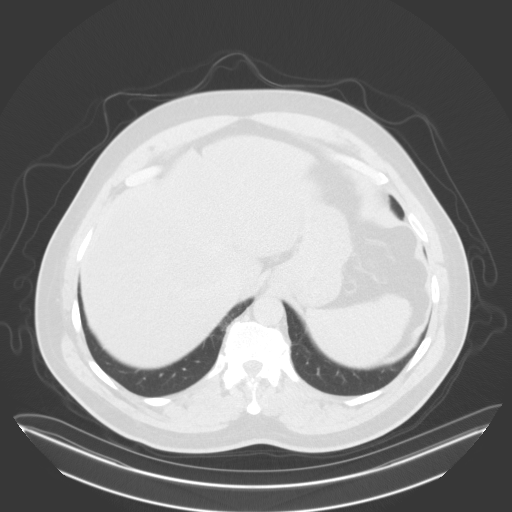
[im 83/95  soft-tissue]
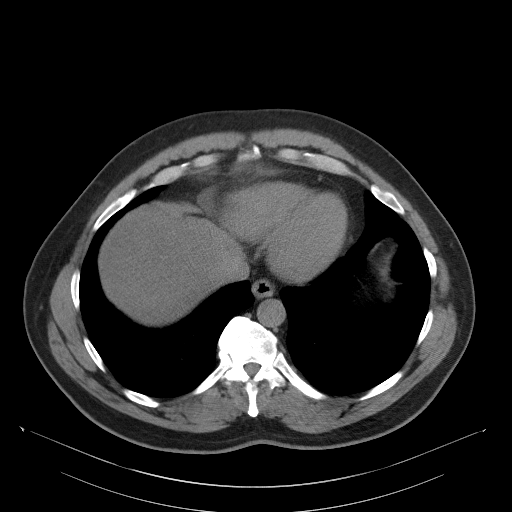
[im 83/95  lung]
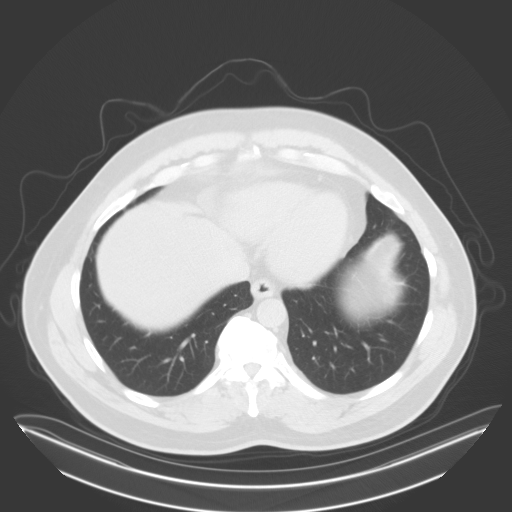
[im 89/95  soft-tissue]
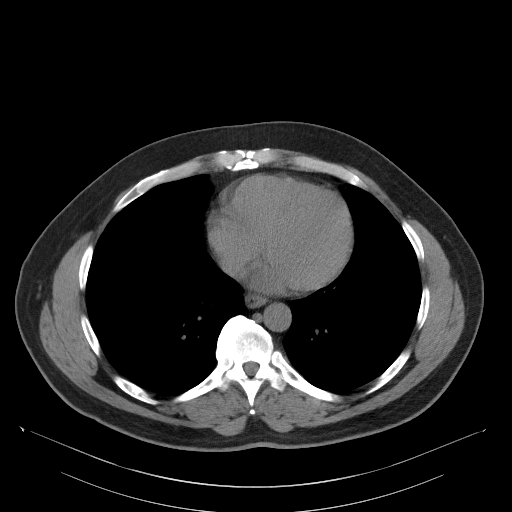
[im 89/95  lung]
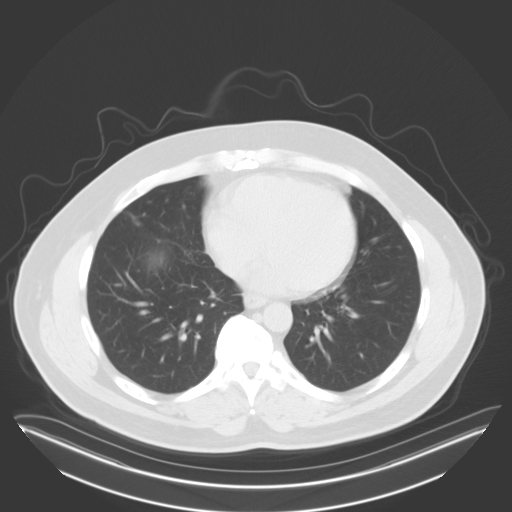

[14 of 32 positions shown; findings below may reference images not displayed]

FINDINGS: Lower chest: No acute abnormality.

Hepatobiliary: Fatty liver without focal lesion. Gallbladder
unremarkable. No biliary ductal dilatation.

Pancreas: Unremarkable. No pancreatic ductal dilatation or
surrounding inflammatory changes.

Spleen: Normal in size without focal abnormality.

Adrenals/Urinary Tract: Adrenal glands unremarkable. Bilateral
fluid-attenuation renal lesions, largest on the right in the upper
pole 5.5 cm, possibly cysts but incompletely characterized. No
hydronephrosis or urolithiasis. Urinary bladder is nondistended.

Stomach/Bowel: Stomach is decompressed. The small bowel is
nondilated. Normal appendix. The colon is nondilated with a few
scattered distal descending and sigmoid diverticula; no adjacent
inflammatory change.

Vascular/Lymphatic: Mild scattered aortoiliac atheromatous calcified
plaque without aneurysm. No abdominal or pelvic adenopathy.

Reproductive: Prostate is unremarkable.

Other: No ascites.  No free air.

Musculoskeletal: Small paraumbilical hernia containing only
mesenteric fat. No fracture or worrisome bone lesion.
IMPRESSION: 1. No acute findings.
2. Fatty liver.
3. Bilateral renal lesions, possibly cysts but incompletely
characterized.
4. Small paraumbilical hernia containing only mesenteric fat.

Aortic Atherosclerosis ([id number].X).

## 2022-03-13 ENCOUNTER — Other Ambulatory Visit: Payer: Self-pay | Admitting: Nurse Practitioner

## 2022-03-13 DIAGNOSIS — E041 Nontoxic single thyroid nodule: Secondary | ICD-10-CM

## 2022-03-15 ENCOUNTER — Telehealth: Payer: Self-pay | Admitting: *Deleted

## 2022-03-15 ENCOUNTER — Telehealth: Payer: Self-pay

## 2022-03-15 NOTE — Telephone Encounter (Signed)
Per patient's insurance no PA is required for sleep study. Call reference # [id number]. Stacy  notified ok to have patient activate itamar device.

## 2022-03-15 NOTE — Telephone Encounter (Signed)
I called the patient to speak with him concerning his Itamar device, LVM for him to return my call

## 2022-03-22 ENCOUNTER — Telehealth: Payer: Self-pay | Admitting: Emergency Medicine

## 2022-03-22 MED ORDER — CARVEDILOL 6.25 MG PO TABS: 6.2500 mg | ORAL_TABLET | Freq: Two times a day (BID) | ORAL | 3 refills | Status: DC

## 2022-03-22 NOTE — Telephone Encounter (Signed)
OK to lift weights that he can perform 10 consecutive reps. Avoid heavier weights for now, please, until I can review the echo.

## 2022-03-22 NOTE — Telephone Encounter (Signed)
Please increase carvedilol to 6.25 mg twice daily.          Edit Comments   Add Notifications    There were very frequent abnormal beats (PVCs) from the bottom chamber of the heart (a quarter of all the recorded beats) . At times these happened back to back in a pattern of what we call nonsustained ventricular tachycardia (VT). More than a quarter of all the heart beats recorded were PVCs. Although these extra beats are not individually of concern, their sheer number is worrisome. The echocardiogram that is already scheduled may be very helpful in deciding what to do about them next, but until then I would recommend increasing the carvedilol to 6.25 mg twice daily.  If this makes your blood pressure low, we can reduce the dose of amlodipine or even stop it altogether.  Could you please send me a blood pressure log (checked once daily, when fully relaxed for at least 10 minutes) in about a couple of weeks, please?    Given the information above. Pt verbalized understanding of all information. Prescription sent for Carvedilol 6.'25mg'$  BID to [facility] in [place]. Pt will keep a log of BP and send in by MyChart. Told pt to let us know of any questions or concerns.  Pt is wondering if he can go back to lifting heavier weights? Does he still need to just lift light weight or can he lift heavier weights?  I told him that I will send his question to Dr Sallyanne Kuster and we will get back to him.

## 2022-03-22 NOTE — Telephone Encounter (Signed)
Croitoru, Dani Gobble, MD  to Me     03/22/22  1:06 PM Note OK to lift weights that he can perform 10 consecutive reps. Avoid heavier weights for now, please, until I can review the echo.      Called pt and gave the above information. Patient verbalized understanding.

## 2022-03-28 ENCOUNTER — Telehealth: Payer: Self-pay

## 2022-03-28 NOTE — Telephone Encounter (Signed)
Per chart referral was sent to Dr. Benjamine Mola for consultation of thyroid nodule. Patient was given contact information for David Humphrey's office.  Per patient David Humphrey's office has sent the referral back, stating they no longer see adults with throat problems.   Please contact patient regarding referral.  Thank you!

## 2022-03-31 ENCOUNTER — Ambulatory Visit (HOSPITAL_COMMUNITY): Payer: BC Managed Care – PPO | Attending: Cardiology

## 2022-03-31 DIAGNOSIS — R9431 Abnormal electrocardiogram [ECG] [EKG]: Secondary | ICD-10-CM | POA: Insufficient documentation

## 2022-03-31 DIAGNOSIS — I10 Essential (primary) hypertension: Secondary | ICD-10-CM | POA: Insufficient documentation

## 2022-03-31 DIAGNOSIS — I493 Ventricular premature depolarization: Secondary | ICD-10-CM | POA: Insufficient documentation

## 2022-03-31 LAB — ECHOCARDIOGRAM COMPLETE
Area-P 1/2: 4.36 cm2
S' Lateral: 3.6 cm

## 2022-04-03 ENCOUNTER — Other Ambulatory Visit: Payer: Self-pay | Admitting: Emergency Medicine

## 2022-04-03 DIAGNOSIS — I493 Ventricular premature depolarization: Secondary | ICD-10-CM

## 2022-04-03 NOTE — Progress Notes (Unsigned)
Called patient and went over results and instructions for Treadmill Stress Test                        Patient Instructions for Stress Test  Medication instructions:   Do NOT take your _______Carvedilol_________________________  Do not eat, drink or use tobacco products four hours prior to the test.  Water is ok.  3.  Dress prepared to exercise in a comfortable, two piece clothing outfit and walking shoes.  4.  Bring any current prescription medications with you the day of the test.  5.  Notify the office 24 hours in advance if you cannot keep this appointment.  6.  If you have any questions, please call [phone].   Pt verbalized understanding. He will receive a phone call to schedule this test.

## 2022-04-13 ENCOUNTER — Ambulatory Visit: Payer: BC Managed Care – PPO

## 2022-04-18 ENCOUNTER — Ambulatory Visit: Payer: BC Managed Care – PPO | Attending: Cardiovascular Disease

## 2022-04-18 DIAGNOSIS — E041 Nontoxic single thyroid nodule: Secondary | ICD-10-CM | POA: Diagnosis not present

## 2022-04-18 DIAGNOSIS — J3489 Other specified disorders of nose and nasal sinuses: Secondary | ICD-10-CM | POA: Insufficient documentation

## 2022-04-18 DIAGNOSIS — L608 Other nail disorders: Secondary | ICD-10-CM | POA: Diagnosis not present

## 2022-04-18 DIAGNOSIS — I493 Ventricular premature depolarization: Secondary | ICD-10-CM | POA: Diagnosis not present

## 2022-04-18 LAB — EXERCISE TOLERANCE TEST
Angina Index: 0
Duke Treadmill Score: 4
Estimated workload: 6
Exercise duration (min): 4 min
Exercise duration (sec): 11 s
MPHR: 161 {beats}/min
Peak HR: 137 {beats}/min
Percent HR: 85 %
RPE: 16
Rest HR: 71 {beats}/min
ST Depression (mm): 0 mm

## 2022-04-19 ENCOUNTER — Other Ambulatory Visit: Payer: Self-pay | Admitting: Otolaryngology

## 2022-04-19 DIAGNOSIS — E041 Nontoxic single thyroid nodule: Secondary | ICD-10-CM

## 2022-04-26 ENCOUNTER — Other Ambulatory Visit: Payer: Self-pay | Admitting: Nurse Practitioner

## 2022-04-26 DIAGNOSIS — I119 Hypertensive heart disease without heart failure: Secondary | ICD-10-CM

## 2022-05-08 ENCOUNTER — Encounter: Payer: Self-pay | Admitting: Nurse Practitioner

## 2022-05-08 ENCOUNTER — Ambulatory Visit: Payer: BC Managed Care – PPO | Admitting: Nurse Practitioner

## 2022-05-08 VITALS — BP 128/72 | HR 75 | Temp 98.1°F | Ht 69.0 in | Wt 221.0 lb

## 2022-05-08 DIAGNOSIS — F5101 Primary insomnia: Secondary | ICD-10-CM | POA: Diagnosis not present

## 2022-05-08 DIAGNOSIS — R0683 Snoring: Secondary | ICD-10-CM

## 2022-05-08 MED ORDER — ESZOPICLONE 3 MG PO TABS: 3.0000 mg | ORAL_TABLET | Freq: Every evening | ORAL | 2 refills | Status: DC | PRN

## 2022-05-08 NOTE — Progress Notes (Signed)
I,Sheena H Holbrook,acting as a Neurosurgeon for Arnette Felts, FNP.,have documented all relevant documentation on the behalf of Arnette Felts, FNP,as directed by  Arnette Felts, FNP while in the presence of Arnette Felts, FNP.    Subjective:     Patient ID: David Humphrey , male    DOB: [birth year] , 62 y.o.   MRN: [id number]   Chief Complaint  Patient presents with   Insomnia    HPI  Patient presents today for concerns about insomnia. Patient previously took Ambien, has also tried Belsomra. He stopped medication and thought he was doing well, however recently feels like he is having a recurrence and that this may be contributing to elevated BP readings. He took belsomra but did not feel if effective. He did not ask for anymore Rxs for sleep at that time. Still felt like he was struggling to sleep. Ambien was effective and Belsomra was not effective.   He is due to having a thyroid biopsy on 4/30 - with Dr. Merrilee Jansky with [facility].   Insomnia Primary symptoms: fragmented sleep, sleep disturbance.   The onset quality is gradual. The problem occurs nightly. The problem has been gradually worsening since onset. The symptoms are relieved by medication Remus Loffler helps in the past). PMH includes: hypertension.      Past Medical History:  Diagnosis Date   Diabetes mellitus without complication (HCC)    Hypertension      Family History  Problem Relation Age of Onset   Hypertension Mother    Kidney disease Mother    Cancer Father    Cancer Paternal Grandmother      Current Outpatient Medications:    amLODipine (NORVASC) 5 MG tablet, TAKE 1 TABLET (5 MG TOTAL) BY MOUTH DAILY., Disp: 90 tablet, Rfl: 2   atorvastatin (LIPITOR) 10 MG tablet, TAKE 1 TABLET BY MOUTH MONDAY, WEDNESDAY AND FRIDAY, Disp: 36 tablet, Rfl: 1   carvedilol (COREG) 6.25 MG tablet, Take 1 tablet (6.25 mg total) by mouth 2 (two) times daily with a meal., Disp: 180 tablet, Rfl: 3   eszopiclone 3 MG TABS, Take 1 tablet (3 mg  total) by mouth at bedtime as needed. Take immediately before bedtime, Disp: 30 tablet, Rfl: 2   lisinopril (ZESTRIL) 10 MG tablet, Take 1 tablet (10 mg total) by mouth daily., Disp: 90 tablet, Rfl: 1   sitaGLIPtin-metformin (JANUMET) 50-1000 MG tablet, TAKE 1 TABLET BY MOUTH TWICE A DAY WITH A MEAL, Disp: 180 tablet, Rfl: 1   testosterone cypionate (DEPOTESTOTERONE CYPIONATE) 100 MG/ML injection, INJECT 1 ML (100 MG TOTAL) INTO THE MUSCLE 2 TIMES A WEEK. (DISCARD VIAL 28 DAYS AFTER PUNCTURES), Disp: 10 mL, Rfl: 5   Vitamin D, Ergocalciferol, (DRISDOL) 1.25 MG (50000 UNIT) CAPS capsule, Take 1 capsule (50,000 Units total) by mouth 2 (two) times a week. (Patient not taking: Reported on 03/03/2022), Disp: 24 capsule, Rfl: 1   No Known Allergies   Review of Systems  Constitutional: Negative.   Respiratory: Negative.    Cardiovascular: Negative.   Gastrointestinal: Negative.   Endocrine: Negative for polydipsia, polyphagia and polyuria.  Neurological: Negative.   Psychiatric/Behavioral:  Positive for sleep disturbance. The patient has insomnia.      Today's Vitals   05/08/22 1410  BP: 128/72  Pulse: 75  Temp: 98.1 F (36.7 C)  TempSrc: Oral  SpO2: 97%  Weight: 221 lb (100.2 kg)  Height: 5\' 9"  (1.753 m)   Body mass index is 32.64 kg/m.   Objective:  Physical Exam Vitals  reviewed.  Constitutional:      General: He is not in acute distress.    Appearance: Normal appearance. He is obese.  Cardiovascular:     Rate and Rhythm: Normal rate and regular rhythm.     Pulses: Normal pulses.     Heart sounds: Normal heart sounds. No murmur heard. Pulmonary:     Effort: Pulmonary effort is normal. No respiratory distress.     Breath sounds: Normal breath sounds. No wheezing.  Musculoskeletal:        General: No swelling.  Skin:    General: Skin is warm and dry.     Capillary Refill: Capillary refill takes less than 2 seconds.  Neurological:     General: No focal deficit present.      Mental Status: He is alert and oriented to person, place, and time.     Cranial Nerves: No cranial nerve deficit.     Motor: No weakness.  Psychiatric:        Mood and Affect: Mood normal.        Behavior: Behavior normal.        Thought Content: Thought content normal.        Judgment: Judgment normal.         Assessment And Plan:     1. Primary insomnia Comments: Had been on ambien previously however will try him on Lunesta, RTO in 6 weeks for f/u - eszopiclone 3 MG TABS; Take 1 tablet (3 mg total) by mouth at bedtime as needed. Take immediately before bedtime  Dispense: 30 tablet; Refill: 2  2. Snoring Comments: He is to do a home sleep study ordered by Cardiology     Patient was given opportunity to ask questions. Patient verbalized understanding of the plan and was able to repeat key elements of the plan. All questions were answered to their satisfaction.  Arnette Felts, FNP   I, Arnette Felts, FNP, have reviewed all documentation for this visit. The documentation on 05/08/22 for the exam, diagnosis, procedures, and orders are all accurate and complete.   IF YOU HAVE BEEN REFERRED TO A SPECIALIST, IT MAY TAKE 1-2 WEEKS TO SCHEDULE/PROCESS THE REFERRAL. IF YOU HAVE NOT HEARD FROM US/SPECIALIST IN TWO WEEKS, PLEASE GIVE [place] A CALL AT [phone].   THE PATIENT IS ENCOURAGED TO PRACTICE SOCIAL DISTANCING DUE TO THE COVID-19 PANDEMIC.

## 2022-05-08 NOTE — Patient Instructions (Addendum)
Stacy at Cardiology office call her with any questions about your sleep study - [phone]. Look on the top left hand corner of the watch that looks like a TV to watch a video about how to use. You will use Pin [phone]

## 2022-05-23 ENCOUNTER — Ambulatory Visit
Admission: RE | Admit: 2022-05-23 | Discharge: 2022-05-23 | Disposition: A | Discharge status: Home / Self Care | Payer: BC Managed Care – PPO | Source: Ambulatory Visit | Attending: Otolaryngology | Admitting: Otolaryngology

## 2022-05-23 ENCOUNTER — Other Ambulatory Visit (HOSPITAL_COMMUNITY)
Admission: RE | Admit: 2022-05-23 | Discharge: 2022-05-23 | Disposition: A | Discharge status: Home / Self Care | Payer: BC Managed Care – PPO | Source: Ambulatory Visit | Attending: Radiology | Admitting: Radiology

## 2022-05-23 DIAGNOSIS — E041 Nontoxic single thyroid nodule: Secondary | ICD-10-CM | POA: Insufficient documentation

## 2022-05-23 DIAGNOSIS — R911 Solitary pulmonary nodule: Secondary | ICD-10-CM | POA: Diagnosis not present

## 2022-05-25 LAB — CYTOLOGY - NON PAP

## 2022-05-28 IMAGING — US US RENAL
1 series · 14 of 25 positions shown · non-contrast
Comparison: None.

CLINICAL DATA: Renal cysts seen on recent CT scan.

EXAM:
RENAL / URINARY TRACT ULTRASOUND COMPLETE

[Series 1: us renal · 0.26mm/px · 14 of 65 slices shown]
[im 1/65]
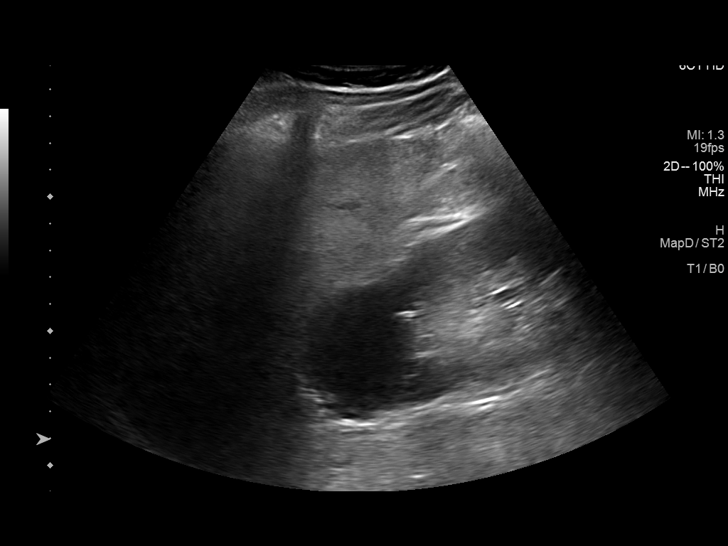
[im 6/65]
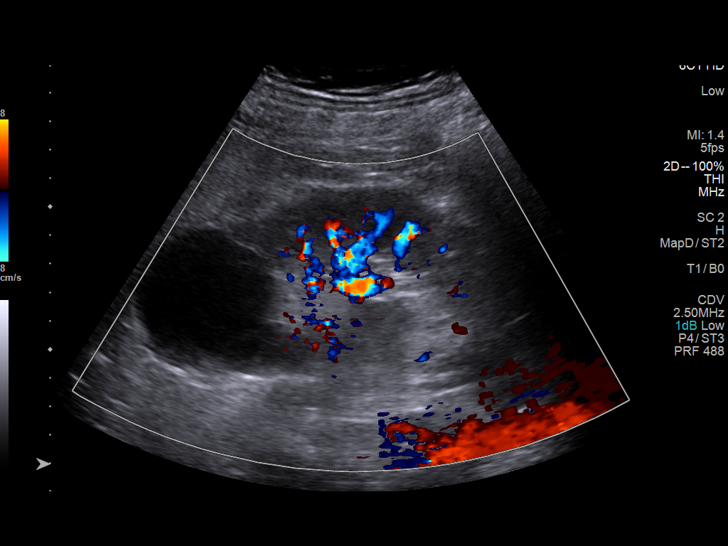
[im 11/65]
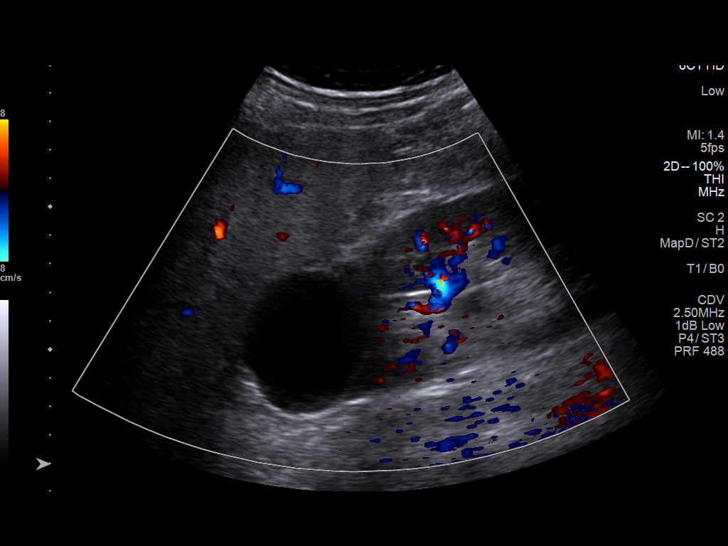
[im 17/65]
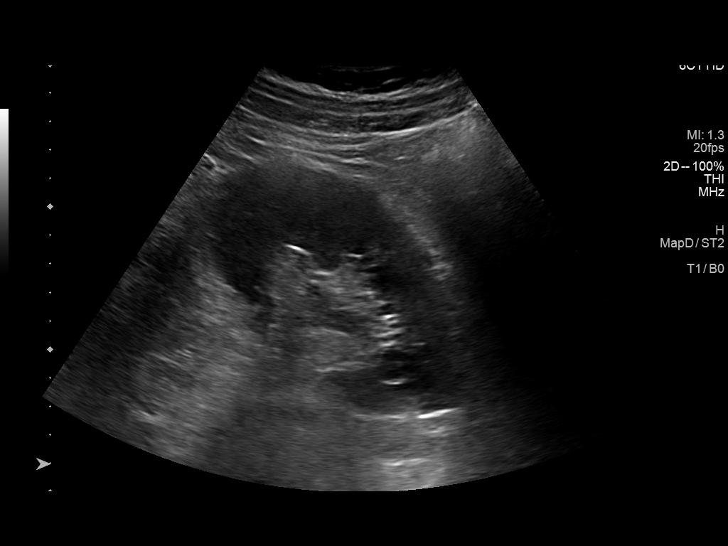
[im 22/65]
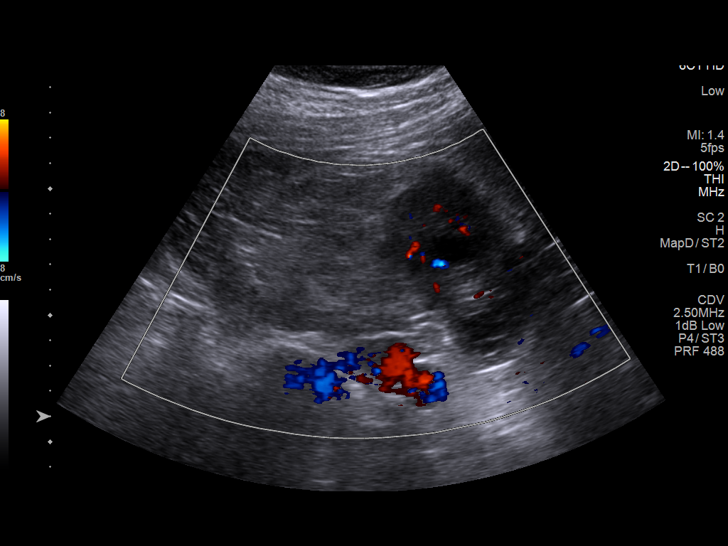
[im 25/65]
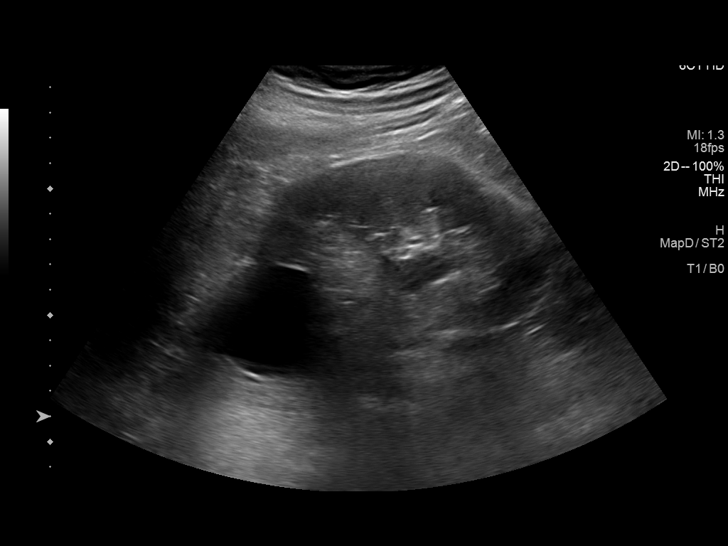
[im 30/65]
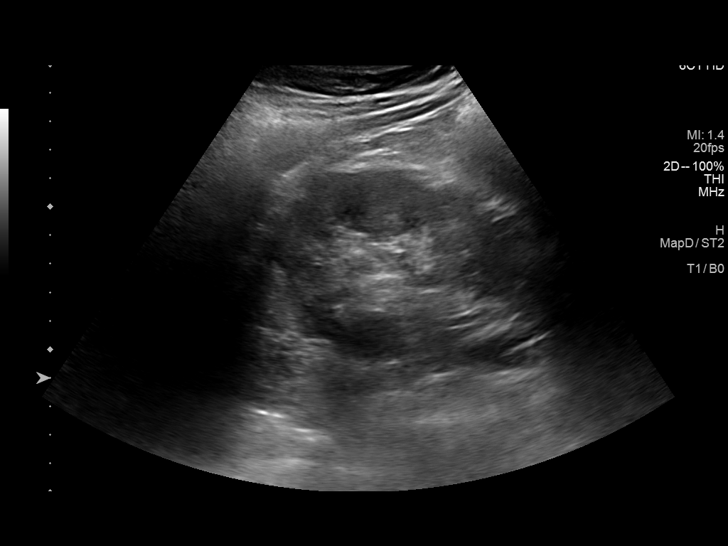
[im 35/65]
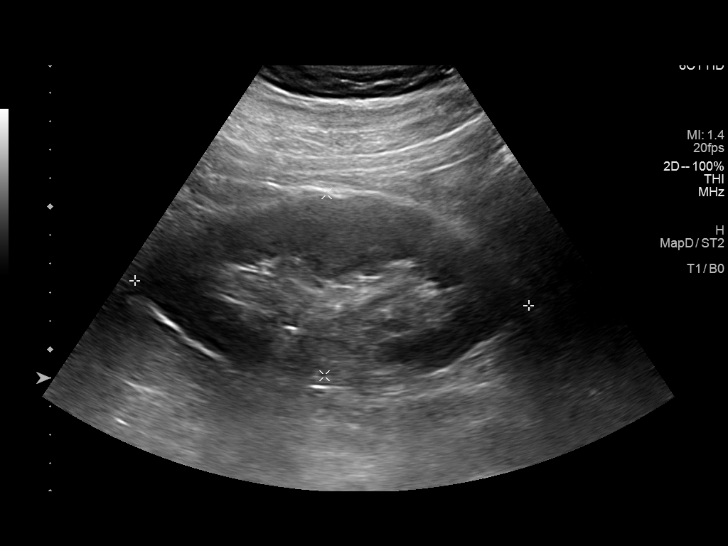
[im 41/65]
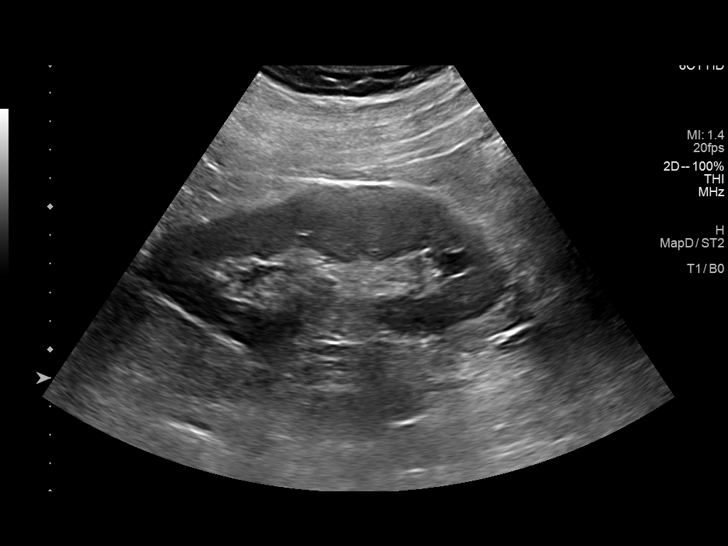
[im 43/65]
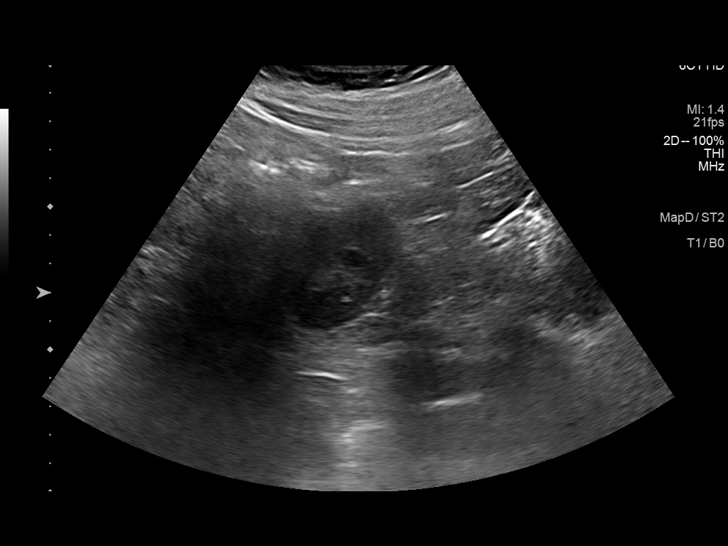
[im 49/65]
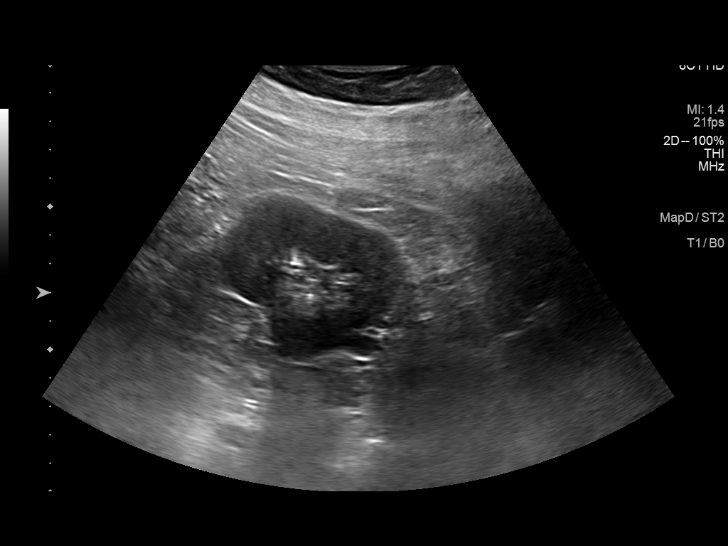
[im 54/65]
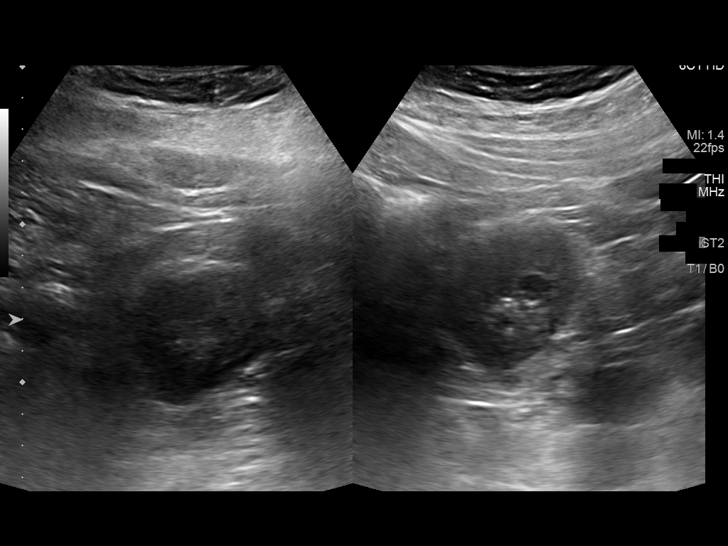
[im 59/65]
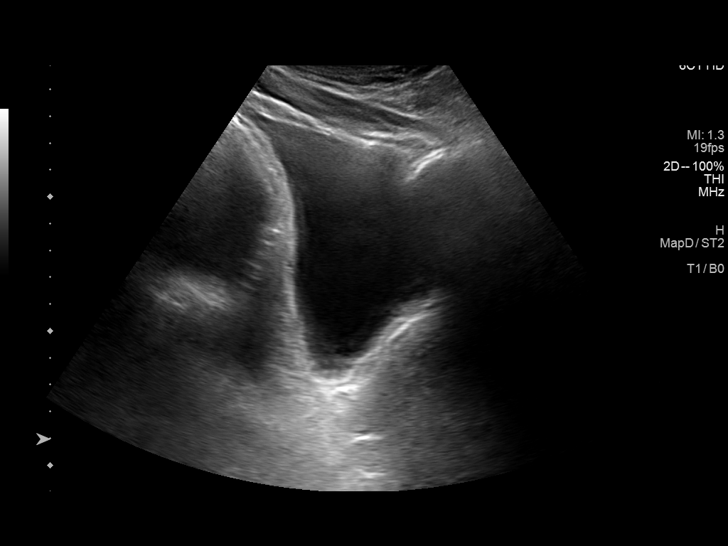
[im 65/65]
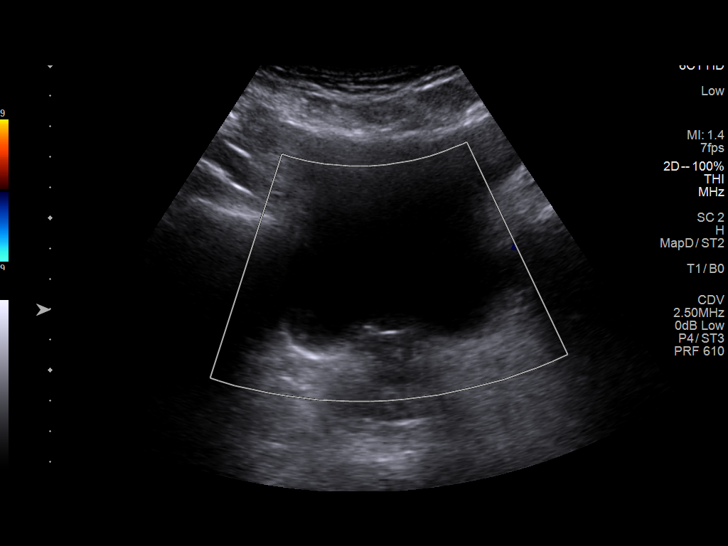

[14 of 25 positions shown; findings below may reference images not displayed]

FINDINGS: Right Kidney:

Renal measurements: 15 x 6.8 x 6.8 cm = volume: 364 mL. Contains 3
cysts measuring 5.1, 2.3, and 2.8 cm respectively. The 2.8 cm cyst
contains cysts a few thin septations.

Left Kidney:

Renal measurements: 13.5 x 5.7 x 5.9 cm = volume: 236 mL. Contains a
cyst in the lower pole measuring 1.2 cm. Contains a septated cyst
off the medial upper pole of the left kidney.

Bladder:

Appears normal for degree of bladder distention.

Other:

None.
IMPRESSION: Bilateral renal cysts.

## 2022-05-29 ENCOUNTER — Other Ambulatory Visit: Payer: Self-pay | Admitting: Nurse Practitioner

## 2022-05-29 DIAGNOSIS — E1165 Type 2 diabetes mellitus with hyperglycemia: Secondary | ICD-10-CM

## 2022-06-06 ENCOUNTER — Telehealth: Payer: Self-pay

## 2022-06-06 NOTE — Telephone Encounter (Signed)
Called and made the patient aware that HE may proceed with the [facility] Sleep Study. PIN # provided to the patient. Patient made aware that HE will be contacted after the test has been read with the results and any recommendations. Patient verbalized understanding and thanked me for the call.   

## 2022-06-08 ENCOUNTER — Encounter (INDEPENDENT_AMBULATORY_CARE_PROVIDER_SITE_OTHER): Payer: BC Managed Care – PPO | Admitting: Cardiology

## 2022-06-08 DIAGNOSIS — R0683 Snoring: Secondary | ICD-10-CM

## 2022-06-08 DIAGNOSIS — G4733 Obstructive sleep apnea (adult) (pediatric): Secondary | ICD-10-CM

## 2022-06-11 ENCOUNTER — Ambulatory Visit: Payer: BC Managed Care – PPO | Attending: Cardiovascular Disease

## 2022-06-11 DIAGNOSIS — R0683 Snoring: Secondary | ICD-10-CM

## 2022-06-11 DIAGNOSIS — I493 Ventricular premature depolarization: Secondary | ICD-10-CM

## 2022-06-11 DIAGNOSIS — R9431 Abnormal electrocardiogram [ECG] [EKG]: Secondary | ICD-10-CM

## 2022-06-11 NOTE — Procedures (Signed)
   SLEEP STUDY REPORT Patient Information Study Date: 06/08/2022 Patient Name: David Humphrey Patient ID: [id number] Birth Date: [birth year] Age: 62 Gender: Male BMI: 34.0 (W=229 lb, H=5' 9'') Referring Physician: Thurmon Fair, MD  TEST DESCRIPTION: Home sleep apnea testing was completed using the WatchPat, a Type 1 device, utilizing peripheral arterial tonometry (PAT), chest movement, actigraphy, pulse oximetry, pulse rate, body position and snore. AHI was calculated with apnea and hypopnea using valid sleep time as the denominator. RDI includes apneas, hypopneas, and RERAs. The data acquired and the scoring of sleep and all associated events were performed in accordance with the recommended standards and specifications as outlined in the AASM Manual for the Scoring of Sleep and Associated Events 2.2.0 (2015).   FINDINGS: 1.  No evidence of Obstructive Sleep Apnea with AHI 4.6/hr.  2.  No Central Sleep Apnea. 3.  Oxygen desaturations as low as 77%. 4.  Moderate snoring was present. O2 sats were < 88% for 0.2 minutes. 5.  Total sleep time was 2 hrs and 49 min. 6.  6.6% of total sleep time was spent in REM sleep.  7.  Normal sleep onset latency at 21 min.  8.  Prolonged REM sleep onset latency at 249 min.  9.  Total awakenings were 18.   DIAGNOSIS:  Non diagnostic study due to lack of adequate sleep time.  RECOMMENDATIONS:   1. Normal study with no significant sleep disordered breathing.  2.  Healthy sleep recommendations include:  adequate nightly sleep (normal 7-9 hrs/night), avoidance of caffeine after noon and alcohol near bedtime, and maintaining a sleep environment that is cool, dark and quiet.  3.  Weight loss for overweight patients is recommended.    4.  Snoring recommendations include:  weight loss where appropriate, side sleeping, and avoidance of alcohol before bed.  5.  Operation of motor vehicle or dangerous equipment must be avoided when feeling drowsy, excessively  sleepy, or mentally fatigued.    6.  An ENT consultation which may be useful for specific causes of and possible treatment of bothersome snoring.   7. Weight loss may be of benefit in reducing the severity of snoring.   Signature: Armanda Magic, MD; [facility]; Diplomat, American Board of Sleep Medicine Electronically Signed: 06/11/2022 8:35:38 AM

## 2022-06-12 ENCOUNTER — Encounter: Payer: Self-pay | Admitting: Cardiovascular Disease

## 2022-06-12 ENCOUNTER — Other Ambulatory Visit: Payer: Self-pay | Admitting: Otolaryngology

## 2022-06-12 ENCOUNTER — Ambulatory Visit: Payer: BC Managed Care – PPO | Attending: Cardiovascular Disease | Admitting: Cardiovascular Disease

## 2022-06-12 VITALS — BP 127/76 | HR 79 | Ht 69.0 in | Wt 226.2 lb

## 2022-06-12 DIAGNOSIS — Z794 Long term (current) use of insulin: Secondary | ICD-10-CM

## 2022-06-12 DIAGNOSIS — E7800 Pure hypercholesterolemia, unspecified: Secondary | ICD-10-CM | POA: Diagnosis not present

## 2022-06-12 DIAGNOSIS — Z7985 Long-term (current) use of injectable non-insulin antidiabetic drugs: Secondary | ICD-10-CM

## 2022-06-12 DIAGNOSIS — I493 Ventricular premature depolarization: Secondary | ICD-10-CM

## 2022-06-12 DIAGNOSIS — I10 Essential (primary) hypertension: Secondary | ICD-10-CM

## 2022-06-12 DIAGNOSIS — N529 Male erectile dysfunction, unspecified: Secondary | ICD-10-CM

## 2022-06-12 DIAGNOSIS — E119 Type 2 diabetes mellitus without complications: Secondary | ICD-10-CM | POA: Diagnosis not present

## 2022-06-12 DIAGNOSIS — E041 Nontoxic single thyroid nodule: Secondary | ICD-10-CM

## 2022-06-12 NOTE — Patient Instructions (Signed)
Medication Instructions:  No changes *If you need a refill on your cardiac medications before your next appointment, please call your pharmacy*  Follow-Up: At [facility], you and your health needs are our priority.  As part of our continuing mission to provide you with exceptional heart care, we have created designated Provider Care Teams.  These Care Teams include your primary Cardiologist (physician) and Advanced Practice Providers (APPs -  Physician Assistants and Nurse Practitioners) who all work together to provide you with the care you need, when you need it.  We recommend signing up for the patient portal called "MyChart".  Sign up information is provided on this After Visit Summary.  MyChart is used to connect with patients for Virtual Visits (Telemedicine).  Patients are able to view lab/test results, encounter notes, upcoming appointments, etc.  Non-urgent messages can be sent to your provider as well.   To learn more about what you can do with MyChart, go to [email or web address].    Your next appointment:   1 year(s)  Provider:   Dr Croitoru  

## 2022-06-12 NOTE — Progress Notes (Unsigned)
Cardiology Office Note:    Date:  06/14/2022   ID:  Townsend, Flocco 1960-10-24, MRN [id number]  PCP:  Arnette Felts, FNP   [facility] Providers Cardiologist:  None     Referring MD: Arnette Felts, FNP   Chief Complaint  Patient presents with   Irregular Heart Beat  David Humphrey is a 62 y.o. male who is being seen today for the evaluation of arrhythmia at the request of Arnette Felts, FNP.   History of Present Illness:    David Humphrey is a 62 y.o. male with a hx of type 2 DM, HTN, HLP without a previous known history of structural heart disease, CAD or PAD who was found to have very frequent premature ventricular contractions during a physical examination with his primary care provider.  On his arrhythmia monitor he had very frequent PVCs representing 26% of all recorded beats and had several episodes of brief nonsustained VT.  All of these episodes were asymptomatic.  PVC morphology strongly suggestive of right ventricular outflow tract origin.  He remains asymptomatic.  He has started exercising again and feels well.The patient specifically denies any chest pain at rest exertion, dyspnea at rest or with exertion, orthopnea, paroxysmal nocturnal dyspnea, syncope, palpitations, focal neurological deficits, intermittent claudication, lower extremity edema, unexplained weight gain, cough, hemoptysis or wheezing.  He has erectile dysfunction which responds well to sildenafil 100 mg.  He has a right carotid bruit but no significant obstruction by duplex ultrasound and has a right thyroid nodule.  He underwent a sleep study that showed a very low frequency of episodes of apnea.  He does not require CPAP.  He had a normal echocardiogram with normal LV thickness and function, no significant valvular abnormalities and without any evidence to suggest right ventricular dysplasia.  He had a normal treadmill stress test.  The study was interrupted after about 4 minutes due to the  ventricular arrhythmia, but he did reach 83% of maximum predicted heart rate.  He was asymptomatic during the test.  Metabolic parameters checked earlier this year reflected a period of time during which she was less active and had gained weight.  Glycemic control has deteriorated with a recent hemoglobin A1c of 8.7%.  His lipid profile is pretty good with an LDL of 71 and HDL of 49 on treatment with atorvastatin.  Past Medical History:  Diagnosis Date   Diabetes mellitus without complication (HCC)    Hypertension     History reviewed. No pertinent surgical history.  Current Medications: Current Meds  Medication Sig   amLODipine (NORVASC) 5 MG tablet TAKE 1 TABLET (5 MG TOTAL) BY MOUTH DAILY.   atorvastatin (LIPITOR) 10 MG tablet TAKE 1 TABLET BY MOUTH MONDAY, WEDNESDAY AND FRIDAY   carvedilol (COREG) 6.25 MG tablet Take 1 tablet (6.25 mg total) by mouth 2 (two) times daily with a meal.   eszopiclone 3 MG TABS Take 1 tablet (3 mg total) by mouth at bedtime as needed. Take immediately before bedtime   lisinopril (ZESTRIL) 10 MG tablet Take 1 tablet (10 mg total) by mouth daily.   sitaGLIPtin-metformin (JANUMET) 50-1000 MG tablet TAKE 1 TABLET BY MOUTH TWICE A DAY WITH MEALS   testosterone cypionate (DEPOTESTOTERONE CYPIONATE) 100 MG/ML injection INJECT 1 ML (100 MG TOTAL) INTO THE MUSCLE 2 TIMES A WEEK. (DISCARD VIAL 28 DAYS AFTER PUNCTURES)     Allergies:   Patient has no known allergies.   Social History   Socioeconomic History   Marital  status: Single    Spouse name: Not on file   Number of children: Not on file   Years of education: Not on file   Highest education level: Not on file  Occupational History   Not on file  Tobacco Use   Smoking status: Never   Smokeless tobacco: Never  Vaping Use   Vaping Use: Never used  Substance and Sexual Activity   Alcohol use: Yes    Comment: socially   Drug use: No   Sexual activity: Yes  Other Topics Concern   Not on file   Social History Narrative   Not on file   Social Determinants of Health   Financial Resource Strain: Not on file  Food Insecurity: Not on file  Transportation Needs: Not on file  Physical Activity: Not on file  Stress: Not on file  Social Connections: Not on file     Family History: The patient's family history includes Cancer in his father and paternal grandmother; Hypertension in his mother; Kidney disease in his mother.  ROS:   Please see the history of present illness.     All other systems reviewed and are negative.  EKGs/Labs/Other Studies Reviewed:    The following studies were reviewed today: Arrhythmia monitor 03/21/2022  Abnormal arrhythmia monitor due to the presence of very frequent premature ventricular contractions (representing over 25% of all recorded beats) as well as a large number of brief episodes of nonsustained ventricular tachycardia.  Multiple PVC morphologies are present, although 2 dominant QRS morphologies are seen.  Echocardiogram 03/31/2022:  1. Frequent PVCs   2. Left ventricular ejection fraction, by estimation, is 50 to 55%. The  left ventricle has low normal function. The left ventricle has no regional  wall motion abnormalities. Left ventricular diastolic parameters were  normal.   3. Right ventricular systolic function is normal. The right ventricular  size is normal. There is normal pulmonary artery systolic pressure. The  estimated right ventricular systolic pressure is 32.4 mmHg.   4. The mitral valve is normal in structure. Trivial mitral valve  regurgitation. No evidence of mitral stenosis.   5. The aortic valve was not well visualized. Aortic valve regurgitation  is not visualized. No aortic stenosis is present.   6. The inferior vena cava is normal in size with greater than 50%  respiratory variability, suggesting right atrial pressure of 3 mmHg.   Treadmill stress test 04/18/2022:   Patient exercised according to the BRUCE  protocol for 4:95min achieving 6.0 METs   Target HR was not achieved (max HR 134bpm (83% MPHR)   Baseline ECG with NSR with occasional PVCs   There is increased ventricular ectopy with stress  with runs of bigeminy and one 4 beat fun of NSVT   Ventricular ectopy improved during recovery   No ST deviation was noted.   ECG nondiagnostic due to failure to achieve 85% MPHR    EKG:  EKG is not ordered today.  The ekg ordered at his previous appointment demonstrates sinus rhythm with frequent PVCs (5 are seen on the 10-second tracing), early RS transition with dominant R in lead V2 but otherwise a fairly normal tracing.  Borderline QTc 479 ms.  The PVCs all have similar morphology (left bundle branch block, normal axis, RS transition in V3).  Recent Labs: 02/21/2022: ALT 25; BUN 10; Creatinine, Ser 0.91; Hemoglobin 13.2; Platelets 187; Potassium 4.1; Sodium 144  Recent Lipid Panel    Component Value Date/Time   CHOL 136 02/21/2022 1214  TRIG 81 02/21/2022 1214   HDL 49 02/21/2022 1214   CHOLHDL 2.8 02/21/2022 1214   LDLCALC 71 02/21/2022 1214     Risk Assessment/Calculations:        STOP-Bang Score:  5       Physical Exam:    VS:  BP 127/76   Pulse 79   Ht 5\' 9"  (1.753 m)   Wt 226 lb 3.2 oz (102.6 kg)   SpO2 98%   BMI 33.40 kg/m     Initial BP was 146/82.  Recheck BP was 127/76  Wt Readings from Last 3 Encounters:  06/12/22 226 lb 3.2 oz (102.6 kg)  05/08/22 221 lb (100.2 kg)  03/03/22 229 lb (103.9 kg)     General: Alert, oriented x3, no distress, appears fit, but is also mild-moderately obese. Head: no evidence of trauma, PERRL, EOMI, no exophtalmos or lid lag, no myxedema, no xanthelasma; normal ears, nose and oropharynx Neck: normal jugular venous pulsations and no hepatojugular reflux; brisk carotid pulses without delay and no carotid bruits Chest: clear to auscultation, no signs of consolidation by percussion or palpation, normal fremitus, symmetrical and full  respiratory excursions Cardiovascular: normal position and quality of the apical impulse, regular rhythm with a vaginal ectopy, normal first and second heart sounds, no murmurs, rubs or gallops Abdomen: no tenderness or distention, no masses by palpation, no abnormal pulsatility or arterial bruits, normal bowel sounds, no hepatosplenomegaly Extremities: no clubbing, cyanosis or edema; 2+ radial, ulnar and brachial pulses bilaterally; 2+ right femoral, posterior tibial and dorsalis pedis pulses; 2+ left femoral, posterior tibial and dorsalis pedis pulses; no subclavian or femoral bruits Neurological: grossly nonfocal Psych: Normal mood and affect   ASSESSMENT:    1. PVC's (premature ventricular contractions)   2. Essential hypertension   3. Type 2 diabetes mellitus without complication, without long-term current use of insulin (HCC)   4. Hypercholesterolemia   5. Erectile dysfunction, unspecified erectile dysfunction type     PLAN:    In order of problems listed above:  PVCs: He has very frequent PVCs, most of which are likely of RV outflow tract origin (although there may also be a second dominant morphology) , but even when he has brief bursts of nonsustained ventricular tachycardia he remains asymptomatic.  No evidence of significant structural abnormalities on echo or treadmill stress test.  I see no reason to limit his physical activity.  Age and ethnicity make it less likely that he has arrhythmogenic cardiomyopathy.  Call us if he develops dizziness/syncope or symptomatic palpitations.  If this happens, I would recommend a cardiac MRI and EP consultation. Abnormal ECG: No evidence of right ventricular hypertrophy or previous posterior infarction, no echo findings to suggest ARVC. HTN: there seems to be some reduction in the frequency of PVCs with carvedilol..  Preferable to keep him on an ACE inhibitor due to his diagnosis of diabetes, but if the beta-blocker works well for him we may  increase the dose and stop his amlodipine. Snoring/hypersomnolence: Sleep study did not show evidence of OSA. DM: reviewed ways to help improve glycemic control by diet and exercise.  If he needs additional medication I would strongly recommend an SGLT2 inhibitor. HLP: Continue atorvastatin. Erectile dysfunction: So far does not appear to be any worse on the carvedilol.           Medication Adjustments/Labs and Tests Ordered: Current medicines are reviewed at length with the patient today.  Concerns regarding medicines are outlined above.  No orders of  the defined types were placed in this encounter.  No orders of the defined types were placed in this encounter.   Patient Instructions  Medication Instructions:  No changes *If you need a refill on your cardiac medications before your next appointment, please call your pharmacy*  Follow-Up: At [facility], you and your health needs are our priority.  As part of our continuing mission to provide you with exceptional heart care, we have created designated Provider Care Teams.  These Care Teams include your primary Cardiologist (physician) and Advanced Practice Providers (APPs -  Physician Assistants and Nurse Practitioners) who all work together to provide you with the care you need, when you need it.  We recommend signing up for the patient portal called "MyChart".  Sign up information is provided on this After Visit Summary.  MyChart is used to connect with patients for Virtual Visits (Telemedicine).  Patients are able to view lab/test results, encounter notes, upcoming appointments, etc.  Non-urgent messages can be sent to your provider as well.   To learn more about what you can do with MyChart, go to [email or web address].    Your next appointment:   1 year(s)  Provider:   Dr Royann Shivers   Signed, Thurmon Fair, MD  06/14/2022 4:09 PM    [facility]

## 2022-06-13 ENCOUNTER — Telehealth: Payer: Self-pay

## 2022-06-13 DIAGNOSIS — G4733 Obstructive sleep apnea (adult) (pediatric): Secondary | ICD-10-CM

## 2022-06-13 NOTE — Telephone Encounter (Signed)
Notified patient of sleep study results and the recommendation to have another test done in the sleep lab with a sleep aid. Patient verbalized understanding and all questions (if any) were answered. Sleep Test ordered today 06/13/22.

## 2022-06-14 ENCOUNTER — Encounter: Payer: Self-pay | Admitting: Cardiovascular Disease

## 2022-06-16 DIAGNOSIS — E041 Nontoxic single thyroid nodule: Secondary | ICD-10-CM | POA: Diagnosis not present

## 2022-06-20 ENCOUNTER — Other Ambulatory Visit: Payer: Self-pay | Admitting: Nurse Practitioner

## 2022-06-22 ENCOUNTER — Ambulatory Visit: Payer: Self-pay | Admitting: Nurse Practitioner

## 2022-07-07 ENCOUNTER — Encounter: Payer: Self-pay | Admitting: *Deleted

## 2022-07-07 NOTE — Progress Notes (Signed)
[facility] Quality Team Note  Name: ISAISH JACKOVICH Date of Birth: [birth year] MRN: [id number] Date: 07/07/2022  [facility] Quality Team has reviewed this patient's chart, please see recommendations below:  [facility] Quality Other; Pt has open gaps for A1C and Colon screening.  Pt has ov scheduled 07/31/22 for dm checkup so will probably do A1C then.  Would provider be able to follow up on colon screening as well. Referral was made and pt was seen by Dr. Loreta Ave.  I couldn't find record of whether pt had colonoscopy done.

## 2022-07-15 ENCOUNTER — Other Ambulatory Visit: Payer: Self-pay | Admitting: Nurse Practitioner

## 2022-07-31 ENCOUNTER — Ambulatory Visit: Payer: BC Managed Care – PPO | Admitting: Nurse Practitioner

## 2022-07-31 NOTE — Progress Notes (Deleted)
Madelaine Bhat, CMA,acting as a Neurosurgeon for Arnette Felts, FNP.,have documented all relevant documentation on the behalf of Arnette Felts, FNP,as directed by  Arnette Felts, FNP while in the presence of Arnette Felts, FNP.  Subjective:  Patient ID: David Humphrey , male    DOB: Sep 22, 1960 , 62 y.o.   MRN: [id number]  No chief complaint on file.   HPI  Patient presents today for a dm, bp. Patient reports compliance with medication and has no other concerns today. Patient denies any chest pain, SOB, and headaches.    Past Medical History:  Diagnosis Date  . Diabetes mellitus without complication (HCC)   . Hypertension      Family History  Problem Relation Age of Onset  . Hypertension Mother   . Kidney disease Mother   . Cancer Father   . Cancer Paternal Grandmother      Current Outpatient Medications:  .  amLODipine (NORVASC) 5 MG tablet, TAKE 1 TABLET (5 MG TOTAL) BY MOUTH DAILY., Disp: 90 tablet, Rfl: 2 .  atorvastatin (LIPITOR) 10 MG tablet, TAKE 1 TABLET BY MOUTH MONDAY, WEDNESDAY AND FRIDAY, Disp: 108 tablet, Rfl: 1 .  carvedilol (COREG) 6.25 MG tablet, Take 1 tablet (6.25 mg total) by mouth 2 (two) times daily with a meal., Disp: 180 tablet, Rfl: 3 .  eszopiclone 3 MG TABS, Take 1 tablet (3 mg total) by mouth at bedtime as needed. Take immediately before bedtime, Disp: 30 tablet, Rfl: 2 .  lisinopril (ZESTRIL) 10 MG tablet, Take 1 tablet (10 mg total) by mouth daily., Disp: 90 tablet, Rfl: 1 .  sitaGLIPtin-metformin (JANUMET) 50-1000 MG tablet, TAKE 1 TABLET BY MOUTH TWICE A DAY WITH MEALS, Disp: 180 tablet, Rfl: 1 .  testosterone cypionate (DEPOTESTOTERONE CYPIONATE) 100 MG/ML injection, INJECT 1 ML (100 MG TOTAL) INTO THE MUSCLE 2 TIMES A WEEK. (DISCARD VIAL 28 DAYS AFTER PUNCTURES), Disp: 10 mL, Rfl: 5 .  Vitamin D, Ergocalciferol, (DRISDOL) 1.25 MG (50000 UNIT) CAPS capsule, Take 1 capsule (50,000 Units total) by mouth 2 (two) times a week. (Patient not taking: Reported on  06/12/2022), Disp: 24 capsule, Rfl: 1   No Known Allergies   Review of Systems   There were no vitals filed for this visit. There is no height or weight on file to calculate BMI.  Wt Readings from Last 3 Encounters:  06/12/22 226 lb 3.2 oz (102.6 kg)  05/08/22 221 lb (100.2 kg)  03/03/22 229 lb (103.9 kg)    The 10-year ASCVD risk score (Arnett DK, et al., 2019) is: 20.7%   Values used to calculate the score:     Age: 10 years     Sex: Male     Is Non-Hispanic African American: Yes     Diabetic: Yes     Tobacco smoker: No     Systolic Blood Pressure: 127 mmHg     Is BP treated: Yes     HDL Cholesterol: 49 mg/dL     Total Cholesterol: 136 mg/dL  Objective:  Physical Exam      Assessment And Plan:  Type 2 diabetes mellitus with hyperglycemia, without long-term current use of insulin (HCC)  Essential hypertension    No follow-ups on file.  Patient was given opportunity to ask questions. Patient verbalized understanding of the plan and was able to repeat key elements of the plan. All questions were answered to their satisfaction.    Jeanell Sparrow, FNP, have reviewed all documentation for this visit. The documentation on  07/31/22 for the exam, diagnosis, procedures, and orders are all accurate and complete.   IF YOU HAVE BEEN REFERRED TO A SPECIALIST, IT MAY TAKE 1-2 WEEKS TO SCHEDULE/PROCESS THE REFERRAL. IF YOU HAVE NOT HEARD FROM US/SPECIALIST IN TWO WEEKS, PLEASE GIVE [place] A CALL AT [phone].

## 2022-08-01 ENCOUNTER — Other Ambulatory Visit: Payer: Self-pay | Admitting: Nurse Practitioner

## 2022-08-01 DIAGNOSIS — N529 Male erectile dysfunction, unspecified: Secondary | ICD-10-CM

## 2022-08-22 ENCOUNTER — Other Ambulatory Visit: Payer: BC Managed Care – PPO

## 2022-08-29 ENCOUNTER — Other Ambulatory Visit: Payer: BC Managed Care – PPO

## 2022-09-04 ENCOUNTER — Ambulatory Visit
Admission: RE | Admit: 2022-09-04 | Discharge: 2022-09-04 | Disposition: A | Discharge status: Home / Self Care | Payer: BC Managed Care – PPO | Source: Ambulatory Visit | Attending: Otolaryngology | Admitting: Otolaryngology

## 2022-09-04 ENCOUNTER — Other Ambulatory Visit (HOSPITAL_COMMUNITY)
Admission: RE | Admit: 2022-09-04 | Discharge: 2022-09-04 | Disposition: A | Discharge status: Home / Self Care | Payer: BC Managed Care – PPO | Source: Ambulatory Visit | Attending: Interventional Radiology | Admitting: Interventional Radiology

## 2022-09-04 DIAGNOSIS — E0789 Other specified disorders of thyroid: Secondary | ICD-10-CM | POA: Diagnosis not present

## 2022-09-04 DIAGNOSIS — E041 Nontoxic single thyroid nodule: Secondary | ICD-10-CM

## 2022-09-11 ENCOUNTER — Other Ambulatory Visit: Payer: Self-pay | Admitting: Otolaryngology

## 2022-09-11 DIAGNOSIS — M47896 Other spondylosis, lumbar region: Secondary | ICD-10-CM | POA: Diagnosis not present

## 2022-09-11 DIAGNOSIS — M791 Myalgia, unspecified site: Secondary | ICD-10-CM | POA: Diagnosis not present

## 2022-09-11 DIAGNOSIS — E041 Nontoxic single thyroid nodule: Secondary | ICD-10-CM

## 2022-09-11 DIAGNOSIS — M545 Low back pain, unspecified: Secondary | ICD-10-CM | POA: Diagnosis not present

## 2022-09-11 DIAGNOSIS — M5451 Vertebrogenic low back pain: Secondary | ICD-10-CM | POA: Diagnosis not present

## 2022-09-14 ENCOUNTER — Other Ambulatory Visit: Payer: Self-pay | Admitting: Nurse Practitioner

## 2022-09-14 ENCOUNTER — Ambulatory Visit
Admission: RE | Admit: 2022-09-14 | Discharge: 2022-09-14 | Disposition: A | Discharge status: Home / Self Care | Payer: BC Managed Care – PPO | Source: Ambulatory Visit | Attending: Otolaryngology | Admitting: Otolaryngology

## 2022-09-14 DIAGNOSIS — I119 Hypertensive heart disease without heart failure: Secondary | ICD-10-CM

## 2022-09-14 DIAGNOSIS — E041 Nontoxic single thyroid nodule: Secondary | ICD-10-CM

## 2022-09-20 ENCOUNTER — Other Ambulatory Visit: Payer: Self-pay | Admitting: Otolaryngology

## 2022-09-20 DIAGNOSIS — E041 Nontoxic single thyroid nodule: Secondary | ICD-10-CM

## 2022-10-31 ENCOUNTER — Encounter: Payer: Self-pay | Admitting: Cardiovascular Disease

## 2022-12-26 ENCOUNTER — Telehealth: Payer: Self-pay

## 2022-12-26 NOTE — Telephone Encounter (Signed)
**Note De-Identified Brylin Stopper Obfuscation** Per Care Coordinators by [facility]) Provider Portal: Approved Authorization #: [id number]

## 2023-02-02 ENCOUNTER — Encounter: Payer: Self-pay | Admitting: Pharmacist

## 2023-02-05 ENCOUNTER — Other Ambulatory Visit: Payer: Self-pay | Admitting: Nurse Practitioner

## 2023-02-05 DIAGNOSIS — F5101 Primary insomnia: Secondary | ICD-10-CM

## 2023-02-05 DIAGNOSIS — E1165 Type 2 diabetes mellitus with hyperglycemia: Secondary | ICD-10-CM

## 2023-02-07 ENCOUNTER — Ambulatory Visit (HOSPITAL_BASED_OUTPATIENT_CLINIC_OR_DEPARTMENT_OTHER): Payer: BC Managed Care – PPO | Attending: Cardiovascular Disease | Admitting: Cardiovascular Disease

## 2023-02-07 DIAGNOSIS — G4733 Obstructive sleep apnea (adult) (pediatric): Secondary | ICD-10-CM | POA: Diagnosis not present

## 2023-02-07 DIAGNOSIS — G4736 Sleep related hypoventilation in conditions classified elsewhere: Secondary | ICD-10-CM | POA: Diagnosis not present

## 2023-02-08 ENCOUNTER — Encounter: Payer: Self-pay | Admitting: Nurse Practitioner

## 2023-02-08 ENCOUNTER — Ambulatory Visit: Payer: BC Managed Care – PPO | Admitting: Nurse Practitioner

## 2023-02-08 VITALS — BP 120/78 | HR 85 | Temp 98.5°F | Ht 69.0 in | Wt 218.8 lb

## 2023-02-08 DIAGNOSIS — E559 Vitamin D deficiency, unspecified: Secondary | ICD-10-CM

## 2023-02-08 DIAGNOSIS — E1165 Type 2 diabetes mellitus with hyperglycemia: Secondary | ICD-10-CM | POA: Diagnosis not present

## 2023-02-08 DIAGNOSIS — N529 Male erectile dysfunction, unspecified: Secondary | ICD-10-CM

## 2023-02-08 DIAGNOSIS — Z2821 Immunization not carried out because of patient refusal: Secondary | ICD-10-CM

## 2023-02-08 DIAGNOSIS — I119 Hypertensive heart disease without heart failure: Secondary | ICD-10-CM

## 2023-02-08 DIAGNOSIS — F5101 Primary insomnia: Secondary | ICD-10-CM

## 2023-02-08 MED ORDER — ATORVASTATIN CALCIUM 10 MG PO TABS: ORAL_TABLET | ORAL | 1 refills | Status: AC

## 2023-02-08 MED ORDER — AMLODIPINE BESYLATE 5 MG PO TABS
5.0000 mg | ORAL_TABLET | Freq: Every day | ORAL | 2 refills | Status: AC
Start: 2023-02-08 — End: 2024-02-08

## 2023-02-08 MED ORDER — VITAMIN D (ERGOCALCIFEROL) 1.25 MG (50000 UNIT) PO CAPS
50000.0000 [IU] | ORAL_CAPSULE | ORAL | 1 refills | Status: AC
Start: 2023-02-08 — End: ?

## 2023-02-08 MED ORDER — LISINOPRIL 10 MG PO TABS: 10.0000 mg | ORAL_TABLET | Freq: Every day | ORAL | 1 refills | Status: DC

## 2023-02-08 MED ORDER — JANUMET 50-1000 MG PO TABS: 1.0000 | ORAL_TABLET | Freq: Two times a day (BID) | ORAL | 1 refills | Status: DC

## 2023-02-08 MED ORDER — ESZOPICLONE 3 MG PO TABS: 3.0000 mg | ORAL_TABLET | Freq: Every evening | ORAL | 5 refills | Status: DC | PRN

## 2023-02-08 MED ORDER — SILDENAFIL CITRATE 100 MG PO TABS: 100.0000 mg | ORAL_TABLET | ORAL | 2 refills | Status: DC | PRN

## 2023-02-08 NOTE — Progress Notes (Signed)
Madelaine Bhat, CMA,acting as a Neurosurgeon for Arnette Felts, FNP.,have documented all relevant documentation on the behalf of Arnette Felts, FNP,as directed by  Arnette Felts, FNP while in the presence of Arnette Felts, FNP.  Subjective:  Patient ID: David Humphrey , male    DOB: [birth year] , 63 y.o.   MRN: [id number]  Chief Complaint  Patient presents with   Hypertension    HPI  Patient presents today for a bp and dm follow up, Patient reports compliance with medication. Patient denies any chest pain, SOB, or headaches. Patient has no concerns today.   He did a sleep study last night.     Past Medical History:  Diagnosis Date   Diabetes mellitus without complication (HCC)    Hypertension      Family History  Problem Relation Age of Onset   Hypertension Mother    Kidney disease Mother    Cancer Father    Cancer Paternal Grandmother      Current Outpatient Medications:    carvedilol (COREG) 6.25 MG tablet, Take 1 tablet (6.25 mg total) by mouth 2 (two) times daily with a meal., Disp: 180 tablet, Rfl: 3   testosterone cypionate (DEPOTESTOTERONE CYPIONATE) 100 MG/ML injection, INJECT 1 ML (100 MG TOTAL) INTO THE MUSCLE 2 TIMES A WEEK. (DISCARD VIAL 28 DAYS AFTER PUNCTURES), Disp: 10 mL, Rfl: 5   amLODipine (NORVASC) 5 MG tablet, Take 1 tablet (5 mg total) by mouth daily., Disp: 90 tablet, Rfl: 2   atorvastatin (LIPITOR) 10 MG tablet, TAKE 1 TABLET BY MOUTH MONDAY, WEDNESDAY AND FRIDAY, Disp: 108 tablet, Rfl: 1   Eszopiclone 3 MG TABS, Take 1 tablet (3 mg total) by mouth at bedtime as needed. Take immediately before bedtime, Disp: 15 tablet, Rfl: 5   lisinopril (ZESTRIL) 10 MG tablet, Take 1 tablet (10 mg total) by mouth daily., Disp: 90 tablet, Rfl: 1   sildenafil (VIAGRA) 100 MG tablet, Take 1 tablet (100 mg total) by mouth as needed for erectile dysfunction., Disp: 6 tablet, Rfl: 2   sitaGLIPtin-metformin (JANUMET) 50-1000 MG tablet, Take 1 tablet by mouth 2 (two) times daily with a  meal., Disp: 180 tablet, Rfl: 1   Vitamin D, Ergocalciferol, (DRISDOL) 1.25 MG (50000 UNIT) CAPS capsule, Take 1 capsule (50,000 Units total) by mouth 2 (two) times a week., Disp: 24 capsule, Rfl: 1   No Known Allergies   Review of Systems  Constitutional: Negative.   Respiratory: Negative.    Cardiovascular: Negative.   Gastrointestinal: Negative.   Endocrine: Negative for polydipsia, polyphagia and polyuria.  Neurological: Negative.   Psychiatric/Behavioral:  Positive for sleep disturbance.      Today's Vitals   02/08/23 1537  BP: 120/78  Pulse: 85  Temp: 98.5 F (36.9 C)  TempSrc: Oral  Weight: 218 lb 12.8 oz (99.2 kg)  Height: 5\' 9"  (1.753 m)  PainSc: 0-No pain   Body mass index is 32.31 kg/m.  Wt Readings from Last 3 Encounters:  02/08/23 218 lb 12.8 oz (99.2 kg)  02/07/23 215 lb (97.5 kg)  06/12/22 226 lb 3.2 oz (102.6 kg)    Objective:  Physical Exam Vitals reviewed.  Constitutional:      General: He is not in acute distress.    Appearance: Normal appearance. He is obese.  Cardiovascular:     Rate and Rhythm: Normal rate and regular rhythm.     Pulses: Normal pulses.     Heart sounds: Normal heart sounds. No murmur heard. Pulmonary:  Effort: Pulmonary effort is normal. No respiratory distress.     Breath sounds: Normal breath sounds. No wheezing.  Musculoskeletal:        General: No swelling.  Skin:    General: Skin is warm and dry.     Capillary Refill: Capillary refill takes less than 2 seconds.  Neurological:     General: No focal deficit present.     Mental Status: He is alert and oriented to person, place, and time.     Cranial Nerves: No cranial nerve deficit.     Motor: No weakness.  Psychiatric:        Mood and Affect: Mood normal.        Behavior: Behavior normal.        Thought Content: Thought content normal.        Judgment: Judgment normal.        Assessment And Plan:  Type 2 diabetes mellitus with hyperglycemia, without  long-term current use of insulin (HCC) -     Hemoglobin A1c -     BMP8+eGFR -     Microalbumin / creatinine urine ratio -     Janumet; Take 1 tablet by mouth 2 (two) times daily with a meal.  Dispense: 180 tablet; Refill: 1  Hypertensive heart disease without heart failure -     BMP8+eGFR -     Microalbumin / creatinine urine ratio -     amLODIPine Besylate; Take 1 tablet (5 mg total) by mouth daily.  Dispense: 90 tablet; Refill: 2 -     Lisinopril; Take 1 tablet (10 mg total) by mouth daily.  Dispense: 90 tablet; Refill: 1  Type 2 diabetes mellitus with hyperglycemia, without long-term current use of insulin (HCC) -     Hemoglobin A1c -     BMP8+eGFR -     Microalbumin / creatinine urine ratio -     Janumet; Take 1 tablet by mouth 2 (two) times daily with a meal.  Dispense: 180 tablet; Refill: 1  Primary insomnia -     Eszopiclone; Take 1 tablet (3 mg total) by mouth at bedtime as needed. Take immediately before bedtime  Dispense: 15 tablet; Refill: 5  Herpes zoster vaccination declined  Influenza vaccination declined  COVID-19 vaccination declined  Hypertensive heart disease without heart failure -     BMP8+eGFR -     Microalbumin / creatinine urine ratio -     amLODIPine Besylate; Take 1 tablet (5 mg total) by mouth daily.  Dispense: 90 tablet; Refill: 2 -     Lisinopril; Take 1 tablet (10 mg total) by mouth daily.  Dispense: 90 tablet; Refill: 1  Vitamin D deficiency -     VITAMIN D 25 Hydroxy (Vit-D Deficiency, Fractures) -     Vitamin D (Ergocalciferol); Take 1 capsule (50,000 Units total) by mouth 2 (two) times a week.  Dispense: 24 capsule; Refill: 1  Erectile dysfunction, unspecified erectile dysfunction type -     Sildenafil Citrate; Take 1 tablet (100 mg total) by mouth as needed for erectile dysfunction.  Dispense: 6 tablet; Refill: 2  Other orders -     Atorvastatin Calcium; TAKE 1 TABLET BY MOUTH MONDAY, WEDNESDAY AND FRIDAY  Dispense: 108 tablet; Refill:  1    Return for keep same next.  Patient was given opportunity to ask questions. Patient verbalized understanding of the plan and was able to repeat key elements of the plan. All questions were answered to their satisfaction.    Susa Griffins  Christell Constant, FNP, have reviewed all documentation for this visit. The documentation on 02/08/23 for the exam, diagnosis, procedures, and orders are all accurate and complete.   IF YOU HAVE BEEN REFERRED TO A SPECIALIST, IT MAY TAKE 1-2 WEEKS TO SCHEDULE/PROCESS THE REFERRAL. IF YOU HAVE NOT HEARD FROM US/SPECIALIST IN TWO WEEKS, PLEASE GIVE [place] A CALL AT [phone].

## 2023-02-09 LAB — MICROALBUMIN / CREATININE URINE RATIO
Creatinine, Urine: 41.4 mg/dL
Microalb/Creat Ratio: <7 mg/g{creat} (ref 0–29)
Microalbumin, Urine: <3 ug/mL

## 2023-02-09 LAB — BMP8+EGFR
BUN/Creatinine Ratio: 10 (ref 10–24)
BUN: 9 mg/dL (ref 8–27)
CO2: 23 mmol/L (ref 20–29)
Calcium: 9.4 mg/dL (ref 8.6–10.2)
Chloride: 99 mmol/L (ref 96–106)
Creatinine, Ser: 0.93 mg/dL (ref 0.76–1.27)
Glucose: 391 mg/dL — ABNORMAL HIGH (ref 70–99)
Potassium: 4.6 mmol/L (ref 3.5–5.2)
Sodium: 138 mmol/L (ref 134–144)
eGFR: 94 mL/min/{1.73_m2} (ref >59)

## 2023-02-09 LAB — VITAMIN D 25 HYDROXY (VIT D DEFICIENCY, FRACTURES): Vit D, 25-Hydroxy: 37.8 ng/mL (ref 30.0–100.0)

## 2023-02-09 LAB — HEMOGLOBIN A1C
Est. average glucose Bld gHb Est-mCnc: 280 mg/dL
Hgb A1c MFr Bld: 11.4 % — ABNORMAL HIGH (ref 4.8–5.6)

## 2023-02-21 ENCOUNTER — Encounter: Payer: Self-pay | Admitting: Nurse Practitioner

## 2023-02-21 DIAGNOSIS — E559 Vitamin D deficiency, unspecified: Secondary | ICD-10-CM | POA: Insufficient documentation

## 2023-02-21 DIAGNOSIS — Z2821 Immunization not carried out because of patient refusal: Secondary | ICD-10-CM | POA: Insufficient documentation

## 2023-02-21 DIAGNOSIS — N529 Male erectile dysfunction, unspecified: Secondary | ICD-10-CM | POA: Insufficient documentation

## 2023-02-21 NOTE — Assessment & Plan Note (Signed)
Declines covid 19 vaccine. Discussed risk of covid 66 and if he changes her mind about the vaccine to call the office. Education has been provided regarding the importance of this vaccine but patient still declined. Advised may receive this vaccine at local pharmacy or Health Dept.or vaccine clinic. Aware to provide a copy of the vaccination record if obtained from local pharmacy or Health Dept.  Encouraged to take multivitamin, vitamin d, vitamin c and zinc to increase immune system. Aware can call office if would like to have vaccine here at office. Verbalized acceptance and understanding.

## 2023-02-21 NOTE — Assessment & Plan Note (Signed)
Will refill his siladenafil

## 2023-02-21 NOTE — Assessment & Plan Note (Signed)
Blood pressure is well controlled, continue current medications.

## 2023-02-21 NOTE — Assessment & Plan Note (Signed)

## 2023-02-21 NOTE — Assessment & Plan Note (Signed)
HgbA1c was 8.1 at last visit he had not been seen in almost a year. Advised needs to be seen at least every 3-4 months. Will recheck levels today.

## 2023-02-21 NOTE — Assessment & Plan Note (Signed)
Will check vitamin D level and supplement as needed.    Also encouraged to spend 15 minutes in the sun daily.

## 2023-02-21 NOTE — Assessment & Plan Note (Signed)
Will refill lunesta, doing well.

## 2023-02-21 NOTE — Assessment & Plan Note (Signed)
Declines shingrix, educated on disease process and is aware if he changes his mind to notify office

## 2023-02-26 ENCOUNTER — Ambulatory Visit (INDEPENDENT_AMBULATORY_CARE_PROVIDER_SITE_OTHER): Payer: BC Managed Care – PPO | Admitting: Nurse Practitioner

## 2023-02-26 ENCOUNTER — Encounter: Payer: Self-pay | Admitting: Nurse Practitioner

## 2023-02-26 VITALS — BP 120/80 | HR 84 | Temp 99.0°F | Ht 69.0 in | Wt 219.8 lb

## 2023-02-26 DIAGNOSIS — Z2821 Immunization not carried out because of patient refusal: Secondary | ICD-10-CM

## 2023-02-26 DIAGNOSIS — F5101 Primary insomnia: Secondary | ICD-10-CM | POA: Diagnosis not present

## 2023-02-26 DIAGNOSIS — I119 Hypertensive heart disease without heart failure: Secondary | ICD-10-CM | POA: Diagnosis not present

## 2023-02-26 DIAGNOSIS — E1165 Type 2 diabetes mellitus with hyperglycemia: Secondary | ICD-10-CM | POA: Diagnosis not present

## 2023-02-26 DIAGNOSIS — I700 Atherosclerosis of aorta: Secondary | ICD-10-CM

## 2023-02-26 DIAGNOSIS — Z79899 Other long term (current) drug therapy: Secondary | ICD-10-CM

## 2023-02-26 DIAGNOSIS — Z0000 Encounter for general adult medical examination without abnormal findings: Secondary | ICD-10-CM | POA: Diagnosis not present

## 2023-02-26 DIAGNOSIS — Z125 Encounter for screening for malignant neoplasm of prostate: Secondary | ICD-10-CM

## 2023-02-26 DIAGNOSIS — E559 Vitamin D deficiency, unspecified: Secondary | ICD-10-CM

## 2023-02-26 LAB — POCT URINALYSIS DIP (CLINITEK)
Bilirubin, UA: NEGATIVE
Blood, UA: NEGATIVE
Glucose, UA: 500 mg/dL — AB
Leukocytes, UA: NEGATIVE
Nitrite, UA: NEGATIVE
POC PROTEIN,UA: NEGATIVE
Spec Grav, UA: 1.015 (ref 1.010–1.025)
Urobilinogen, UA: 1 U/dL
pH, UA: 5.5 (ref 5.0–8.0)

## 2023-02-26 MED ORDER — SYNJARDY XR 25-1000 MG PO TB24: 1.0000 | ORAL_TABLET | Freq: Every day | ORAL | 1 refills | Status: DC

## 2023-02-26 MED ORDER — ESZOPICLONE 3 MG PO TABS
3.0000 mg | ORAL_TABLET | Freq: Every evening | ORAL | 5 refills | Status: DC | PRN
Start: 2023-02-26 — End: 2023-11-28

## 2023-02-26 NOTE — Patient Instructions (Signed)
Health Maintenance  Topic Date Due   Eye exam for diabetics  Never done   Colon Cancer Screening  Never done   Complete foot exam   04/15/2022   COVID-19 Vaccine (4 - 2024-25 season) 09/24/2022   Flu Shot  04/23/2023*   Zoster (Shingles) Vaccine (1 of 2) 05/09/2023*   Pneumococcal Vaccination (1 of 2 - PCV) 02/08/2024*   Hemoglobin A1C  08/08/2023   Yearly kidney function blood test for diabetes  02/08/2024   Yearly kidney health urinalysis for diabetes  02/08/2024   DTaP/Tdap/Td vaccine (2 - Td or Tdap) 07/15/2028   Hepatitis C Screening  Completed   HIV Screening  Completed   HPV Vaccine  Aged Out  *Topic was postponed. The date shown is not the original due date.

## 2023-02-26 NOTE — Progress Notes (Unsigned)
Madelaine Bhat, CMA,acting as a Neurosurgeon for Arnette Felts, FNP.,have documented all relevant documentation on the behalf of Arnette Felts, FNP,as directed by  Arnette Felts, FNP while in the presence of Arnette Felts, FNP.  Subjective:   Patient ID: David Humphrey , male    DOB: [birth year] , 63 y.o.   MRN: [id number]  Chief Complaint  Patient presents with   Annual Exam    HPI  Patient presents today for HM, Patient reports compliance with medication. Patient denies any chest pain, SOB, or headaches. Patient has no concerns today.      Past Medical History:  Diagnosis Date   Diabetes mellitus without complication (HCC)    Hypertension      Family History  Problem Relation Age of Onset   Hypertension Mother    Kidney disease Mother    Cancer Father    Cancer Paternal Grandmother      Current Outpatient Medications:    amLODipine (NORVASC) 5 MG tablet, Take 1 tablet (5 mg total) by mouth daily., Disp: 90 tablet, Rfl: 2   atorvastatin (LIPITOR) 10 MG tablet, TAKE 1 TABLET BY MOUTH MONDAY, WEDNESDAY AND FRIDAY, Disp: 108 tablet, Rfl: 1   carvedilol (COREG) 6.25 MG tablet, Take 1 tablet (6.25 mg total) by mouth 2 (two) times daily with a meal., Disp: 180 tablet, Rfl: 3   Empagliflozin-metFORMIN HCl ER (SYNJARDY XR) 25-1000 MG TB24, Take 1 tablet by mouth daily., Disp: 90 tablet, Rfl: 1   Eszopiclone 3 MG TABS, Take 1 tablet (3 mg total) by mouth at bedtime as needed. Take immediately before bedtime, Disp: 15 tablet, Rfl: 5   lisinopril (ZESTRIL) 10 MG tablet, Take 1 tablet (10 mg total) by mouth daily., Disp: 90 tablet, Rfl: 1   sildenafil (VIAGRA) 100 MG tablet, Take 1 tablet (100 mg total) by mouth as needed for erectile dysfunction., Disp: 6 tablet, Rfl: 2   testosterone cypionate (DEPOTESTOTERONE CYPIONATE) 100 MG/ML injection, INJECT 1 ML (100 MG TOTAL) INTO THE MUSCLE 2 TIMES A WEEK. (DISCARD VIAL 28 DAYS AFTER PUNCTURES), Disp: 10 mL, Rfl: 5   Vitamin D, Ergocalciferol,  (DRISDOL) 1.25 MG (50000 UNIT) CAPS capsule, Take 1 capsule (50,000 Units total) by mouth 2 (two) times a week., Disp: 24 capsule, Rfl: 1   No Known Allergies   Men's preventive visit. Patient Health Questionnaire (PHQ-2) is  Flowsheet Row Office Visit from 02/08/2023 in [facility]  PHQ-2 Total Score 0     Patient is on a regular diet mostly chicken and vegetables. He has also increased his water intake.  Exercising - 3 days on 1 day off - total 6 days a week. Marital status: Single. Relevant history for alcohol use is:  Social History   Substance and Sexual Activity  Alcohol Use Yes   Comment: socially  . Relevant history for tobacco use is:  Social History   Tobacco Use  Smoking Status Never  Smokeless Tobacco Never  .   Review of Systems  Constitutional: Negative.   HENT: Negative.    Eyes: Negative.   Respiratory: Negative.    Cardiovascular: Negative.   Gastrointestinal: Negative.   Endocrine: Negative.   Genitourinary: Negative.   Musculoskeletal: Negative.   Skin: Negative.   Allergic/Immunologic: Negative.   Neurological: Negative.   Hematological: Negative.   Psychiatric/Behavioral: Negative.       Today's Vitals   02/26/23 1408  BP: 120/80  Pulse: 84  Temp: 99 F (37.2 C)  TempSrc: Oral  Weight: 219 lb 12.8 oz (99.7 kg)  Height: 5\' 9"  (1.753 m)  PainSc: 0-No pain   Body mass index is 32.46 kg/m.  Wt Readings from Last 3 Encounters:  02/26/23 219 lb 12.8 oz (99.7 kg)  02/08/23 218 lb 12.8 oz (99.2 kg)  02/07/23 215 lb (97.5 kg)    Objective:  Physical Exam Vitals reviewed.  Constitutional:      General: He is not in acute distress.    Appearance: Normal appearance. He is obese. He is not ill-appearing.  HENT:     Head: Normocephalic and atraumatic.     Right Ear: Tympanic membrane, ear canal and external ear normal.     Left Ear: Tympanic membrane, ear canal and external ear normal.     Nose: Nose normal.      Mouth/Throat:     Mouth: Mucous membranes are moist.  Eyes:     Extraocular Movements: Extraocular movements intact.     Conjunctiva/sclera: Conjunctivae normal.     Pupils: Pupils are equal, round, and reactive to light.  Neck:     Vascular: No carotid bruit.  Cardiovascular:     Rate and Rhythm: Normal rate and regular rhythm.     Pulses: Normal pulses.     Heart sounds: Normal heart sounds. No murmur heard.    Comments: Bruit to right carotid Pulmonary:     Effort: Pulmonary effort is normal. No respiratory distress.     Breath sounds: Normal breath sounds. No wheezing.  Abdominal:     General: Abdomen is flat. Bowel sounds are normal. There is no distension.     Palpations: Abdomen is soft.     Tenderness: There is no abdominal tenderness. There is no guarding or rebound.     Comments: Ventral Hernia present  Genitourinary:    Comments: Deferred at patient request Musculoskeletal:        General: Normal range of motion.     Cervical back: Normal range of motion and neck supple. No tenderness.  Feet:     Right foot:     Skin integrity: Callus and dry skin present.     Toenail Condition: Fungal disease present.    Left foot:     Skin integrity: Callus and dry skin present.     Toenail Condition: Fungal disease present. Lymphadenopathy:     Cervical: No cervical adenopathy.  Skin:    General: Skin is warm and dry.     Capillary Refill: Capillary refill takes less than 2 seconds.     Findings: No rash.     Comments: Onychomycosis present on bilateral toes   Neurological:     General: No focal deficit present.     Mental Status: He is alert and oriented to person, place, and time.  Psychiatric:        Mood and Affect: Mood normal.        Behavior: Behavior normal.        Thought Content: Thought content normal.        Judgment: Judgment normal.         Assessment And Plan:    Encounter for annual health examination  Type 2 diabetes mellitus with  hyperglycemia, without long-term current use of insulin (HCC) -     EKG 12-Lead -     POCT URINALYSIS DIP (CLINITEK) -     Lipid panel -     BMP8+eGFR; Future -     Synjardy XR; Take 1 tablet by mouth daily.  Dispense:  90 tablet; Refill: 1  Hypertensive heart disease without heart failure  Primary insomnia  Vitamin D deficiency  Atherosclerosis of aorta (HCC)  COVID-19 vaccination declined  Other long term (current) drug therapy -     CBC with Differential/Platelet  Encounter for prostate cancer screening -     PSA Total (Reflex To Free)    Return for 1 year physical, controlled DM check 8-10 weeks; 2 week NV Ozempic teaching 0.25mg  and repeat BMP. Patient was given opportunity to ask questions. Patient verbalized understanding of the plan and was able to repeat key elements of the plan. All questions were answered to their satisfaction.   Arnette Felts, FNP  I, Arnette Felts, FNP, have reviewed all documentation for this visit. The documentation on 02/26/23 for the exam, diagnosis, procedures, and orders are all accurate and complete.

## 2023-02-26 NOTE — Assessment & Plan Note (Signed)
Will d/c his janumet and switch to [place].  And will start him on Ozempic in 2 weeks with a BMP with eGFR.

## 2023-02-27 ENCOUNTER — Other Ambulatory Visit: Payer: BC Managed Care – PPO

## 2023-02-27 DIAGNOSIS — E1165 Type 2 diabetes mellitus with hyperglycemia: Secondary | ICD-10-CM | POA: Diagnosis not present

## 2023-02-27 LAB — BMP8+EGFR
BUN/Creatinine Ratio: 10 (ref 10–24)
BUN: 9 mg/dL (ref 8–27)
CO2: 23 mmol/L (ref 20–29)
Calcium: 9.8 mg/dL (ref 8.6–10.2)
Chloride: 103 mmol/L (ref 96–106)
Creatinine, Ser: 0.9 mg/dL (ref 0.76–1.27)
Glucose: 177 mg/dL — ABNORMAL HIGH (ref 70–99)
Potassium: 4.6 mmol/L (ref 3.5–5.2)
Sodium: 142 mmol/L (ref 134–144)
eGFR: 98 mL/min/{1.73_m2} (ref >59)

## 2023-02-28 ENCOUNTER — Encounter: Payer: Self-pay | Admitting: Nurse Practitioner

## 2023-03-04 NOTE — Assessment & Plan Note (Signed)
Behavior modifications discussed and diet history reviewed.   Pt will continue to exercise regularly and modify diet with low GI, plant based foods and decrease intake of processed foods.  Recommend intake of daily multivitamin, Vitamin D, and calcium.  Recommend colonoscopy for preventive screenings, as well as recommend immunizations that include influenza, TDAP, and Shingles (declines)

## 2023-03-04 NOTE — Assessment & Plan Note (Signed)

## 2023-03-04 NOTE — Assessment & Plan Note (Signed)
 Continue statin, tolerating well

## 2023-03-04 NOTE — Assessment & Plan Note (Signed)
 Will check vitamin D level and supplement as needed.    Also encouraged to spend 15 minutes in the sun daily.

## 2023-03-04 NOTE — Assessment & Plan Note (Signed)
 Will refill lunesta, doing well.

## 2023-03-04 NOTE — Assessment & Plan Note (Addendum)
 Blood pressure is well controlled, continue current medications. EKG done NSR HR 78

## 2023-03-05 ENCOUNTER — Encounter (HOSPITAL_BASED_OUTPATIENT_CLINIC_OR_DEPARTMENT_OTHER): Payer: Self-pay | Admitting: Cardiovascular Disease

## 2023-03-05 NOTE — Procedures (Signed)
 Patient Name: David Humphrey, David Humphrey Date: 02/07/2023 Gender: Male D.O.B: Oct 13, 1960 Age (years): 56 Referring Provider: Magnus Schuller MD, ABSM Height (inches): 69 Interpreting Physician: Magnus Schuller MD, ABSM Weight (lbs): 215 RPSGT: Roosvelt Colla BMI: 32 MRN: [id number] Neck Size: 15.50  CLINICAL INFORMATION Sleep Study Type: NPSG  Indication for sleep study: Daytime Fatigue, Diabetes, Fatigue, Hypertension, Obesity, Snoring   Epworth Sleepiness Score: 8  Most recent polysomnogram dated 06/08/2022 revealed an AHI of 4.6/h.  SLEEP STUDY TECHNIQUE As per the AASM Manual for the Scoring of Sleep and Associated Events v2.3 (April 2016) with a hypopnea requiring 4% desaturations.  The channels recorded and monitored were frontal, central and occipital EEG, electrooculogram (EOG), submentalis EMG (chin), nasal and oral airflow, thoracic and abdominal wall motion, anterior tibialis EMG, snore microphone, electrocardiogram, and pulse oximetry.  MEDICATIONS atorvastatin  (LIPITOR) 10 MG tablet carvedilol  (COREG ) 6.25 MG tablet Empagliflozin-metFORMIN  HCl ER (SYNJARDY  XR) 25-1000 MG TB24 Eszopiclone  3 MG TABS lisinopril  (ZESTRIL ) 10 MG tablet sildenafil  (VIAGRA ) 100 MG tablet testosterone  cypionate (DEPOTESTOTERONE CYPIONATE) 100 MG/ML injection Vitamin D , Ergocalciferol , (DRISDOL ) 1.25 MG (50000 UNIT) CAPS capsule Medications self-administered by patient taken the night of the study : janumet , LISINOPRIL , LUNESTA   SLEEP ARCHITECTURE The study was initiated at 9:56:35 PM and ended at 4:56:51 AM.  Sleep onset time was 25.0 minutes and the sleep efficiency was 75.8%. The total sleep time was 318.5 minutes.  Stage REM latency was 71.0 minutes.  The patient spent 13.5% of the night in stage N1 sleep, 80.4% in stage N2 sleep, 0.0% in stage N3 and 6.1% in REM.  Alpha intrusion was absent.  Supine sleep was 8.01%.  RESPIRATORY PARAMETERS The overall apnea/hypopnea index  (AHI) was 5.3 per hour. The respiratory disturbance index (RDI) was 15.4/h. There were 6 total apneas, including 5 obstructive, 1 central and 0 mixed apneas. There were 22 hypopneas and 54 RERAs.  The AHI during Stage REM sleep was 12.3 per hour.  AHI while supine was 49.4 per hour.  The mean oxygen saturation was 95.4%. The minimum SpO2 during sleep was 89.0%.  Soft snoring was noted during this study.  CARDIAC DATA The 2 lead EKG demonstrated sinus rhythm. The mean heart rate was 75.0 beats per minute. Other EKG findings include: PVCs.  LEG MOVEMENT DATA The total PLMS were 0 with a resulting PLMS index of 0.0. Associated arousal with leg movement index was 0.4 .  IMPRESSIONS - Mild obstructive sleep apnea overall (AHI 5.3/h; RDI 15.4/h); however, events were worse during REM sleep (AHI 12.3/h) and severe with supine sleep (AHI 49.5/h). - No significant central sleep apnea occurred during this study (CAI = 0.2/h). - The patient had minimal or no oxygen desaturation during the study (Min O2 89.0%) - The patient snored with soft snoring volume. - EKG findings include PVCs. - Clinically significant periodic limb movements did not occur during sleep. No significant associated arousals.  DIAGNOSIS - Obstructive Sleep Apnea (G47.33) - Nocturnal Hypoxemia (G47.36)  RECOMMENDATIONS - Recommend therapeutic CPAP for treatment of sleep disordered breathing. Initiate a trial of Auto-PAP with EPR of 3 at 6 - 16 cm of water.  - Efforts should be made to optimize nasal and oropharyngeal patency. - Positional therapy avoiding supine position during sleep. - Avoid alcohol, sedatives and other CNS depressants that may worsen sleep apnea and disrupt normal sleep architecture. - Sleep hygiene should be reviewed to assess factors that may improve sleep quality. - Weight management and regular exercise should be initiated or  continued if appropriate.  [Electronically signed] 03/05/2023 04:38  PM  Magnus Schuller MD, [facility], ABSM Diplomate, American Board of Sleep Medicine NPI: [id number]  [facility] PH: [phone]   FX: [phone] ACCREDITED BY THE AMERICAN ACADEMY OF SLEEP MEDICINE

## 2023-03-09 ENCOUNTER — Telehealth: Payer: Self-pay

## 2023-03-09 DIAGNOSIS — E1165 Type 2 diabetes mellitus with hyperglycemia: Secondary | ICD-10-CM

## 2023-03-09 NOTE — Telephone Encounter (Signed)
Patient was identified as falling into the [place] Measure - Diabetes.   Patient was: Appointment scheduled for lab or office visit for A1c.

## 2023-03-12 ENCOUNTER — Telehealth: Payer: Self-pay

## 2023-03-12 ENCOUNTER — Telehealth: Payer: Self-pay | Admitting: Pharmacist

## 2023-03-12 DIAGNOSIS — E7800 Pure hypercholesterolemia, unspecified: Secondary | ICD-10-CM

## 2023-03-12 DIAGNOSIS — R0683 Snoring: Secondary | ICD-10-CM

## 2023-03-12 DIAGNOSIS — E119 Type 2 diabetes mellitus without complications: Secondary | ICD-10-CM

## 2023-03-12 DIAGNOSIS — Z125 Encounter for screening for malignant neoplasm of prostate: Secondary | ICD-10-CM | POA: Diagnosis not present

## 2023-03-12 DIAGNOSIS — I493 Ventricular premature depolarization: Secondary | ICD-10-CM

## 2023-03-12 DIAGNOSIS — I10 Essential (primary) hypertension: Secondary | ICD-10-CM

## 2023-03-12 DIAGNOSIS — N5201 Erectile dysfunction due to arterial insufficiency: Secondary | ICD-10-CM | POA: Diagnosis not present

## 2023-03-12 DIAGNOSIS — E1165 Type 2 diabetes mellitus with hyperglycemia: Secondary | ICD-10-CM

## 2023-03-12 DIAGNOSIS — E291 Testicular hypofunction: Secondary | ICD-10-CM | POA: Diagnosis not present

## 2023-03-12 DIAGNOSIS — G4733 Obstructive sleep apnea (adult) (pediatric): Secondary | ICD-10-CM

## 2023-03-12 NOTE — Telephone Encounter (Signed)
-----   Message from Nicki Guadalajara sent at 03/08/2023  3:37 PM EST ----- Eliseo Gum, please contact patient with the results of the sleep study.  Initiate AutoPap as prescribed with EPR of 3 at a pressure range of 6 to 16 cm of water.  Arrange for follow-up sleep evaluation for compliance in approximately 2 months.

## 2023-03-12 NOTE — Telephone Encounter (Signed)
Notified patient, via VM, of sleep study results and recommendations. CPAP device and supplies ordered to Initiate AutoPap as prescribed with EPR of 3 at a pressure range of 6 to 16 cm of water.

## 2023-03-12 NOTE — Progress Notes (Signed)
   03/12/2023  Patient ID: David Humphrey, male   DOB: 09-13-1960, 63 y.o.   MRN: [id number]   Reason for referral: Medication Management  Referral source:  [place] Metric Report  Reason for call: Diabetes Management-[employer]  Outreach:  Unsuccessful telephone call attempt #1 to patient.   HIPAA compliant voicemail left requesting a return call  Plan:  -I will make another outreach attempt to patient within 3-4 business days.   Beecher Mcardle, PharmD, BCACP Clinical Pharmacist [phone]

## 2023-03-13 ENCOUNTER — Ambulatory Visit: Payer: Self-pay

## 2023-03-14 ENCOUNTER — Telehealth: Payer: Self-pay | Admitting: Pharmacist

## 2023-03-14 DIAGNOSIS — E1165 Type 2 diabetes mellitus with hyperglycemia: Secondary | ICD-10-CM

## 2023-03-14 MED ORDER — ONETOUCH VERIO W/DEVICE KIT: PACK | 0 refills | Status: AC

## 2023-03-14 MED ORDER — ONETOUCH VERIO VI STRP: ORAL_STRIP | 3 refills | Status: AC

## 2023-03-14 MED ORDER — ONETOUCH DELICA PLUS LANCET30G MISC: 3 refills | Status: AC

## 2023-03-14 NOTE — Progress Notes (Signed)
03/14/2023 Name: David Humphrey MRN: [id number] DOB: [birth year]  Chief Complaint  Patient presents with   Medication Management    Diabetes    David Humphrey is a 63 y.o. year old male who presented for a telephone visit.   They were referred to the pharmacist by their PCP for assistance in managing diabetes.  [place] Metric Report  Subjective:  Care Team: Primary Care Provider: Arnette Felts, FNP ; Next Scheduled Visit: 03/16/2023   Medication Access/Adherence  Current Pharmacy:  [id number] [place] - [address] [place] [address] Phone: [phone] Fax: [phone]   Patient reports affordability concerns with their medications: Yes  Patient reports access/transportation concerns to their pharmacy: No  Patient reports adherence concerns with their medications:  No      Diabetes:  Current medications:  Synjardy XR 1000/25 1 tablet daily  Current glucose readings: Not checking blood sugar due to his not working  Patient denies hypoglycemic s/sx including  dizziness, shakiness, sweating. Patient reports hyperglycemic symptoms including  polyuria, polydipsia, polyphagia, nocturia   Current meal patterns:  - Breakfast: [place]- eggs,bacon, grits, biscuit - Lunch  subway-teriyaki sub - Supper baled chicken, green beans,wings - Snacks Pringles, oranges - Drinks minute maid lemonade  Current physical activity: works out five-six days per week weight lifting and cardio    Hypertension:  Current medications:   Cavedilol 6.25 1 tablet twice daily Lisinopril 10 mg 1 tablet daily Amlodipine 5 mg 1 tablet daily B/P 120/80 02/26/2023  Hyperlipidemia/ASCVD Risk Reduction  Current lipid lowering medications:  Atorvastatin 10 mg LDL-71 from 2024  The 10-year ASCVD risk score (Arnett DK, et al., 2019) is: 19.5%   Values used to calculate the score:     Age: 82 years     Sex: Male     Is Non-Hispanic African  American: Yes     Diabetic: Yes     Tobacco smoker: No     Systolic Blood Pressure: 120 mmHg     Is BP treated: Yes     HDL Cholesterol: 49 mg/dL     Total Cholesterol: 136 mg/dL     Objective:  Lab Results  Component Value Date   HGBA1C 11.4 (H) 02/08/2023    Lab Results  Component Value Date   CREATININE 0.90 02/27/2023   BUN 9 02/27/2023   NA 142 02/27/2023   K 4.6 02/27/2023   CL 103 02/27/2023   CO2 23 02/27/2023    Lab Results  Component Value Date   CHOL 136 02/21/2022   HDL 49 02/21/2022   LDLCALC 71 02/21/2022   TRIG 81 02/21/2022   CHOLHDL 2.8 02/21/2022    Medications Reviewed Today     Reviewed by Beecher Mcardle, RPH (Pharmacist) on 03/14/23 at 1029  Med List Status: <None>   Medication Order Taking? Sig Documenting Provider Last Dose Status Informant  amLODipine (NORVASC) 5 MG tablet [id number] Yes Take 1 tablet (5 mg total) by mouth daily. Arnette Felts, FNP Taking Active   atorvastatin (LIPITOR) 10 MG tablet [id number] Yes TAKE 1 TABLET BY MOUTH MONDAY, WEDNESDAY AND Elpidio Galea, FNP Taking Active   carvedilol (COREG) 6.25 MG tablet [id number] Yes Take 1 tablet (6.25 mg total) by mouth 2 (two) times daily with a meal. Croitoru, Mihai, MD Taking Active   Empagliflozin-metFORMIN HCl ER (SYNJARDY XR) 25-1000 MG TB24 [id number]  Take 1 tablet by mouth daily. Arnette Felts, FNP  Active   Eszopiclone 3 MG TABS  [id number] Yes Take 1 tablet (3 mg total) by mouth at bedtime as needed. Take immediately before bedtime Arnette Felts, FNP Taking Active   lisinopril (ZESTRIL) 10 MG tablet [id number] Yes Take 1 tablet (10 mg total) by mouth daily. Arnette Felts, FNP Taking Active   sildenafil (VIAGRA) 100 MG tablet [id number] Yes Take 1 tablet (100 mg total) by mouth as needed for erectile dysfunction. Arnette Felts, FNP Taking Active   testosterone cypionate (DEPOTESTOTERONE CYPIONATE) 100 MG/ML injection [id number] No INJECT 1 ML (100 MG TOTAL) INTO THE MUSCLE 2  TIMES A WEEK. (DISCARD VIAL 28 DAYS AFTER PUNCTURES)  Patient not taking: Reported on 03/14/2023   Arnette Felts, FNP Not Taking Active   Vitamin D, Ergocalciferol, (DRISDOL) 1.25 MG (50000 UNIT) CAPS capsule [id number] Yes Take 1 capsule (50,000 Units total) by mouth 2 (two) times a week. Arnette Felts, FNP Taking Active               Assessment/Plan:   Diabetes: - Currently uncontrolled A1c 11.4 % - Reviewed dietary modifications including decreasing the amount of carbohydrate consumption - Recommend to begin to check blood sugar  -Has commercial insurance so not a candidate for Patient Assistance Programming  -Got the patient approved for a copay card that can be billed in tandem with his insurance for [facility] when He goes to pick it up. -Called Patient's insurance to find out which Meter is covered on his plan.Marland KitchenMarland Kitchen[employer] Touch [employer] testing supplies -Will send in new prescription with Provider's approval    Hypertension: - Currently controlled - Continue Current therapy    Hyperlipidemia/ASCVD Risk Reduction: - Currently controlled per last labs.  - Recommend to have new labs drawn.     Follow Up Plan:    Send new script for Testing Supplies to Patient's Pharmacy Send patient copay card and a coupon for a free meter via MyChart Follow up with patient in 2 weeks. He has an appointment with his PCP on 03/16/2023.  Beecher Mcardle, PharmD, [facility] Clinical Pharmacist [facility] [phone]

## 2023-03-14 NOTE — Progress Notes (Deleted)
Clinical Pharmacist [phone]

## 2023-03-16 ENCOUNTER — Ambulatory Visit: Payer: Self-pay

## 2023-03-16 VITALS — BP 122/80 | HR 85 | Temp 98.6°F | Ht 69.0 in | Wt 219.0 lb

## 2023-03-16 DIAGNOSIS — E1165 Type 2 diabetes mellitus with hyperglycemia: Secondary | ICD-10-CM

## 2023-03-16 MED ORDER — SEMAGLUTIDE(0.25 OR 0.5MG/DOS) 2 MG/3ML ~~LOC~~ SOPN: PEN_INJECTOR | SUBCUTANEOUS | 2 refills | Status: DC

## 2023-03-16 NOTE — Progress Notes (Signed)
Patient presents today for Ozempic teaching, patient verbalized understanding. Patients dose day in Friday.

## 2023-03-22 ENCOUNTER — Telehealth: Payer: Self-pay | Admitting: Pharmacist

## 2023-03-22 DIAGNOSIS — E1165 Type 2 diabetes mellitus with hyperglycemia: Secondary | ICD-10-CM

## 2023-03-22 NOTE — Progress Notes (Signed)
   03/22/2023  Patient ID: David Humphrey, male   DOB: [birth year], 63 y.o.   MRN: [id number]  T  Reason for referral: Medication Management   Reason for call: follow up on blood glucose meter. Patient also just got started on Ozempic.   Outreach:  Unsuccessful telephone call attempt #1 to patient.   HIPAA compliant voicemail left requesting a return call  Plan:  -I will make another outreach attempt to patient in 1 week.Beecher Mcardle, PharmD, BCACP Clinical Pharmacist [phone]

## 2023-03-30 ENCOUNTER — Telehealth: Payer: Self-pay | Admitting: Pharmacist

## 2023-03-30 DIAGNOSIS — E1165 Type 2 diabetes mellitus with hyperglycemia: Secondary | ICD-10-CM

## 2023-03-30 NOTE — Progress Notes (Signed)
   03/30/2023  Patient ID: David Humphrey, male   DOB: 05-12-1960, 63 y.o.   MRN: [id number]  [facility]) Quality Pharmacy Team  [facility]   03/30/2023  David Humphrey Oct 12, 1960 [id number]   Reason for referral: Medication Management  Referral source:  Arnette Felts   Reason for call: Follow up on blood sugars   Outreach:  Unsuccessful telephone call attempt #1 to patient.   Unable to leave message  Plan:  -I will make another outreach attempt to patient in 2 weeks.    Beecher Mcardle, PharmD, BCACP Clinical Pharmacist [phone]

## 2023-04-02 ENCOUNTER — Other Ambulatory Visit: Payer: Self-pay | Admitting: Nurse Practitioner

## 2023-04-18 ENCOUNTER — Telehealth: Payer: Self-pay | Admitting: Pharmacist

## 2023-04-18 DIAGNOSIS — E1165 Type 2 diabetes mellitus with hyperglycemia: Secondary | ICD-10-CM

## 2023-04-18 NOTE — Progress Notes (Signed)
   04/18/2023  Patient ID: David Humphrey, male   DOB: [birth year], 63 y.o.   MRN: [id number]    Reason for referral: Medication Management  Referral source:  Arnette Felts, FNP   Reason for call: Diabetes Management  Outreach:  Unsuccessful telephone call attempt #2 to patient.   HIPAA compliant voicemail left requesting a return call  Plan:  -I will make another outreach attempt to patient in 1 month.    Beecher Mcardle, PharmD, BCACP Clinical Pharmacist [phone]

## 2023-04-30 ENCOUNTER — Ambulatory Visit: Payer: Self-pay | Admitting: Nurse Practitioner

## 2023-04-30 NOTE — Progress Notes (Deleted)
 Madelaine Bhat, CMA,acting as a Neurosurgeon for Arnette Felts, FNP.,have documented all relevant documentation on the behalf of Arnette Felts, FNP,as directed by  Arnette Felts, FNP while in the presence of Arnette Felts, FNP.  Subjective:  Patient ID: David Humphrey , David    DOB: [birth year] , 63 y.o.   MRN: [id number]  No chief complaint on file.   HPI  Patient presents today for a bp and dm follow up, Patient reports compliance with medication. Patient denies any chest pain, SOB, or headaches. Patient has no concerns today.     Past Medical History:  Diagnosis Date  . Diabetes mellitus without complication (HCC)   . Hypertension      Family History  Problem Relation Age of Onset  . Hypertension Mother   . Kidney disease Mother   . Cancer Father   . Cancer Paternal Grandmother      Current Outpatient Medications:  .  amLODipine (NORVASC) 5 MG tablet, Take 1 tablet (5 mg total) by mouth daily., Disp: 90 tablet, Rfl: 2 .  atorvastatin (LIPITOR) 10 MG tablet, TAKE 1 TABLET BY MOUTH MONDAY, WEDNESDAY AND FRIDAY, Disp: 108 tablet, Rfl: 1 .  Blood Glucose Monitoring Suppl (ONETOUCH VERIO) w/Device KIT, Use to test blood sugar three times daily Diagnosis code E11.65, Disp: 1 kit, Rfl: 0 .  carvedilol (COREG) 6.25 MG tablet, TAKE 1 TABLET BY MOUTH 2 TIMES DAILY WITH A MEAL., Disp: 180 tablet, Rfl: 3 .  Empagliflozin-metFORMIN HCl ER (SYNJARDY XR) 25-1000 MG TB24, Take 1 tablet by mouth daily., Disp: 90 tablet, Rfl: 1 .  Eszopiclone 3 MG TABS, Take 1 tablet (3 mg total) by mouth at bedtime as needed. Take immediately before bedtime, Disp: 15 tablet, Rfl: 5 .  glucose blood (ONETOUCH VERIO) test strip, Use to test blood sugar three times daily Diagnosis code E11.65, Disp: 300 each, Rfl: 3 .  Lancets (ONETOUCH DELICA PLUS LANCET30G) MISC, Use to test blood sugar three times daily Diagnosis code E11.65, Disp: 300 each, Rfl: 3 .  lisinopril (ZESTRIL) 10 MG tablet, Take 1 tablet (10 mg total) by  mouth daily., Disp: 90 tablet, Rfl: 1 .  Semaglutide,0.25 or 0.5MG /DOS, 2 MG/3ML SOPN, Inject into skin every Friday as directed!, Disp: 3 mL, Rfl: 2 .  sildenafil (VIAGRA) 100 MG tablet, Take 1 tablet (100 mg total) by mouth as needed for erectile dysfunction., Disp: 6 tablet, Rfl: 2 .  testosterone cypionate (DEPOTESTOTERONE CYPIONATE) 100 MG/ML injection, INJECT 1 ML (100 MG TOTAL) INTO THE MUSCLE 2 TIMES A WEEK. (DISCARD VIAL 28 DAYS AFTER PUNCTURES) (Patient not taking: Reported on 03/16/2023), Disp: 10 mL, Rfl: 5 .  Vitamin D, Ergocalciferol, (DRISDOL) 1.25 MG (50000 UNIT) CAPS capsule, Take 1 capsule (50,000 Units total) by mouth 2 (two) times a week., Disp: 24 capsule, Rfl: 1   No Known Allergies   Review of Systems   There were no vitals filed for this visit. There is no height or weight on file to calculate BMI.  Wt Readings from Last 3 Encounters:  03/16/23 219 lb (99.3 kg)  02/26/23 219 lb 12.8 oz (99.7 kg)  02/08/23 218 lb 12.8 oz (99.2 kg)    The 10-year ASCVD risk score (Arnett DK, et al., 2019) is: 20.1%   Values used to calculate the score:     Age: 15 years     Sex: David     Is Non-Hispanic African American: Yes     Diabetic: Yes     Tobacco smoker:  No     Systolic Blood Pressure: 122 mmHg     Is BP treated: Yes     HDL Cholesterol: 49 mg/dL     Total Cholesterol: 136 mg/dL  Objective:  Physical Exam      Assessment And Plan:  Type 2 diabetes mellitus with hyperglycemia, without long-term current use of insulin (HCC)  Hypertensive heart disease without heart failure    No follow-ups on file.  Patient was given opportunity to ask questions. Patient verbalized understanding of the plan and was able to repeat key elements of the plan. All questions were answered to their satisfaction.    Jeanell Sparrow, FNP, have reviewed all documentation for this visit. The documentation on 04/30/23 for the exam, diagnosis, procedures, and orders are all accurate and  complete.   IF YOU HAVE BEEN REFERRED TO A SPECIALIST, IT MAY TAKE 1-2 WEEKS TO SCHEDULE/PROCESS THE REFERRAL. IF YOU HAVE NOT HEARD FROM US/SPECIALIST IN TWO WEEKS, PLEASE GIVE [place] A CALL AT [phone].

## 2023-05-08 ENCOUNTER — Telehealth: Payer: Self-pay

## 2023-05-08 NOTE — Telephone Encounter (Signed)
error 

## 2023-05-25 ENCOUNTER — Telehealth: Payer: Self-pay | Admitting: Pharmacist

## 2023-05-25 DIAGNOSIS — E1165 Type 2 diabetes mellitus with hyperglycemia: Secondary | ICD-10-CM

## 2023-05-25 NOTE — Progress Notes (Signed)
   05/25/2023  Patient ID: David Humphrey, male   DOB: 1960-11-17, 63 y.o.   MRN: [id number]  Patient was called to follow up on blood sugars. Unfortunately, he did not answer the phone. HIPAA compiiant message was left on his voicemail.  Patient does not have another appointment until 02/26  Plan:  Call patient back in 1 week to follow up on blood sugars and to get his rescheduled with Susanna Epley NP. His last HgA1c was >11 and he does not have a PCP visit before 02/26.

## 2023-06-04 ENCOUNTER — Telehealth: Payer: Self-pay | Admitting: Pharmacist

## 2023-06-04 DIAGNOSIS — E1165 Type 2 diabetes mellitus with hyperglycemia: Secondary | ICD-10-CM

## 2023-06-04 NOTE — Progress Notes (Signed)
   06/04/2023  Patient ID: David Humphrey, male   DOB: 06-30-1960, 63 y.o.   MRN: [id number]   Patient was called to follow up on diabetes management. Unfortunately, He did not answer his phone. HIPAA compliant message was left on his voicemail.  Patient missed his April appointment and has not had blood work since February. His last recorded A1c was >11%.  Reviewed Dr. Anson Basta, no fill history to support his chronic medications being filled this year. Epic shows Synjarday filled 04/02/23 but Dr. Anson Basta does not support it being filled.  Plan: Call Patient back in 2-3 weeks.   David Humphrey, PharmD, BCACP Clinical Pharmacist [phone]

## 2023-06-21 ENCOUNTER — Other Ambulatory Visit: Payer: Self-pay | Admitting: Nurse Practitioner

## 2023-06-21 DIAGNOSIS — E1165 Type 2 diabetes mellitus with hyperglycemia: Secondary | ICD-10-CM

## 2023-06-22 ENCOUNTER — Telehealth: Payer: Self-pay

## 2023-06-22 ENCOUNTER — Other Ambulatory Visit: Payer: Self-pay

## 2023-06-22 DIAGNOSIS — E1165 Type 2 diabetes mellitus with hyperglycemia: Secondary | ICD-10-CM

## 2023-06-22 MED ORDER — SEMAGLUTIDE(0.25 OR 0.5MG/DOS) 2 MG/3ML ~~LOC~~ SOPN: PEN_INJECTOR | SUBCUTANEOUS | 2 refills | Status: DC

## 2023-06-22 NOTE — Telephone Encounter (Signed)
 Unable to complete PA for Ozempic  due to Pts insurance. Patient is now switching to cobra insurance and hasn't received information. PT advised to bring information to office when he gets it.

## 2023-06-22 NOTE — Telephone Encounter (Signed)
 Spoke with patient. Patient is currently under "cobra" insurance and hasn't received his card yet. Patient was advised to bring card by office when he receives it. Patient advised to get sample since PA is unable to be completed.

## 2023-06-22 NOTE — Telephone Encounter (Signed)
 Patient is calling to report that he is having difficulty obtaining Semaglutide ,0.25 or 0.5MG /DOS, 2 MG/3ML SOPN [[id number]] from the pharmacy. Reporting the last 2-3 times that he has gotten it from the office. Patient is calling to see if he can receive samples again. Patient was transferred to [facility].

## 2023-06-22 NOTE — Telephone Encounter (Signed)
 Attempted to review RX benefits.

## 2023-06-25 ENCOUNTER — Telehealth: Payer: Self-pay | Admitting: Pharmacist

## 2023-06-25 DIAGNOSIS — E1165 Type 2 diabetes mellitus with hyperglycemia: Secondary | ICD-10-CM

## 2023-06-25 NOTE — Progress Notes (Signed)
   06/25/2023  Patient ID: David Humphrey, male   DOB: [birth year], 63 y.o.   MRN: [id number]  Patient left a voicemail on my mailbox on Friday about Ozempic .  I called the Patient back this morning. Unfortunately, He did not answer the phone. HIPAA compliant message was left on his voicemail.  From Chart Review

## 2023-07-02 ENCOUNTER — Telehealth: Payer: Self-pay | Admitting: Pharmacist

## 2023-07-02 DIAGNOSIS — E1165 Type 2 diabetes mellitus with hyperglycemia: Secondary | ICD-10-CM

## 2023-07-02 NOTE — Progress Notes (Signed)
   07/02/2023  Patient ID: David Humphrey, male   DOB: 12/14/1960, 63 y.o.   MRN: [id number]  Called Patient to follow up on blood sugars.  Unfortunately, he did not answer his phone. HIPAA compliant message was left on his voicemail requesting a call back.  HgA1c from January was >11%  I was able to speak with the patient once this year but have not been successful reaching him via telephone or [facility] [employer].  Reviewed Dr. Anson Basta. ---No meds filled this year with the exception of Testosterone . Has been given samples Synjardy  and Ozempic   Plan:  Follow up with the Patient in 2 weeks.   Geronimo Krabbe, PharmD, BCACP Clinical Pharmacist [phone]    Plan: Call Patient back in 2-3 weeks   Geronimo Krabbe, PharmD, [facility] Clinical Pharmacist [phone]

## 2023-07-11 ENCOUNTER — Telehealth: Payer: Self-pay | Admitting: Pharmacist

## 2023-07-11 DIAGNOSIS — E1165 Type 2 diabetes mellitus with hyperglycemia: Secondary | ICD-10-CM

## 2023-07-11 NOTE — Progress Notes (Signed)
   07/11/2023  Patient ID: David Humphrey, male   DOB: 19-Feb-1960, 63 y.o.   MRN: [id number]  Patient was called regarding diabetes management. Unfortunately, he did not answer his phone. HIPAA compliant message was left on his voicemail. Patient has been called multiple times unsuccessfully. A message has been left for the patient each time.  He is on the [employer] Report for diabetes.  Plan: Call Patient back in 1 month.   Geronimo Krabbe, PharmD, BCACP Clinical Pharmacist [phone]

## 2023-07-26 ENCOUNTER — Telehealth: Payer: Self-pay | Admitting: Nurse Practitioner

## 2023-07-26 NOTE — Telephone Encounter (Signed)
 Called patient since he has not been in to the office since January and has an elevated A1c. He is in between jobs and is using his [employer] for 3 months. He can come to the office to pick up samples of ozempic  and Synjardy  (30 days). He will come on Monday

## 2023-08-01 ENCOUNTER — Telehealth: Payer: Self-pay | Admitting: Pharmacist

## 2023-08-01 DIAGNOSIS — E1165 Type 2 diabetes mellitus with hyperglycemia: Secondary | ICD-10-CM

## 2023-08-01 NOTE — Progress Notes (Signed)
   08/01/2023  Patient ID: David Humphrey, male   DOB: 01-Jun-1960, 63 y.o.   MRN: [id number]  Patient was called regarding diabetes management. Unfortunately, he did not answer his phone. Patient has been called multiple times over the last few months unsuccessfully.  He has not been seen by his PCP since February.  Plan: Follow up after his annual wellness visit in August.   David Humphrey, PharmD, [facility] Clinical Pharmacist [facility] [phone]

## 2023-08-27 ENCOUNTER — Other Ambulatory Visit: Payer: Self-pay | Admitting: Otolaryngology

## 2023-08-27 ENCOUNTER — Encounter: Payer: Self-pay | Admitting: Otolaryngology

## 2023-08-27 DIAGNOSIS — E041 Nontoxic single thyroid nodule: Secondary | ICD-10-CM

## 2023-08-29 ENCOUNTER — Telehealth: Payer: Self-pay | Admitting: Pharmacy Technician

## 2023-08-29 NOTE — Progress Notes (Signed)
   08/29/2023  Patient ID: David Humphrey, male   DOB: 18-Mar-1960, 63 y.o.   MRN: [id number]  Patient engaged with clinical pharmacist for management of diabetes on 06/22/2023. Outreach by [employer] technician was requested.   Outreached patient to discuss diabetes medication management. Left voicemail for patient to return my call at their convenience.   Jaspreet Hollings, CPhT [facility] Office: [phone] Email: [email or web address]

## 2023-08-31 ENCOUNTER — Telehealth: Payer: Self-pay | Admitting: Pharmacy Technician

## 2023-08-31 NOTE — Progress Notes (Signed)
   08/31/2023  Patient ID: David Humphrey, male   DOB: [birth year], 63 y.o.   MRN: [id number]  Patient engaged with clinical pharmacist for management of diabetes on 06/22/2023. Outreach by [employer] technician was requested.   Outreached patient to discuss diabetes medication management. Left voicemail for patient to return my call at their convenience.   Miko Sirico, CPhT [place] Office: [phone] Email: [email or web address]

## 2023-09-05 ENCOUNTER — Telehealth: Payer: Self-pay | Admitting: Pharmacy Technician

## 2023-09-05 NOTE — Progress Notes (Signed)
   09/05/2023  Patient ID: Fairy JULIANNA Birmingham, male   DOB: 1960-08-16, 63 y.o.   MRN: [id number]  Patient engaged with clinical pharmacist for management of diabetes on 06/22/2023. Outreach by [employer] technician was requested.   Outreached patient to discuss diabetes medication management. Left voicemail for patient to return my call at their convenience. Sent patient mychart message today.  Nicholi Ghuman, CPhT [facility] Office: [phone] Email: [email or web address]

## 2023-09-12 ENCOUNTER — Encounter: Payer: Self-pay | Admitting: Pharmacist

## 2023-09-12 ENCOUNTER — Telehealth: Payer: Self-pay | Admitting: Pharmacist

## 2023-09-12 DIAGNOSIS — E1165 Type 2 diabetes mellitus with hyperglycemia: Secondary | ICD-10-CM

## 2023-09-12 NOTE — Progress Notes (Signed)
   09/12/2023  Patient ID: David Humphrey, male   DOB: [birth year], 63 y.o.   MRN: [id number]  Called Patient regarding diabetes management. HIPAA identifiers were obtained. Unfortunately, he did not answer the phone. HIPAA compliant message was left on his voicemail.    Patient has not been seen or had lab work since January. His next PCP visit is in February of 2026.  I have not been able to reach the Patient by phone. He has been receiving samples of Ozempic  and Synjardy  at the office as recent as July.  A1c in January was 11.4%  Lisinopril  10 mg filled 04/25 Carvedilol  6.25 last filled 03/25  Plan: Call Patient back in 1 month.   [name], PharmD, BCACP Clinical Pharmacist [phone]

## 2023-09-12 NOTE — Telephone Encounter (Signed)
-----   Message from Cassius Brought sent at 08/01/2023  9:36 AM EDT ----- Regarding: FW: Call patient TNM  ----- Message ----- From: Brought Cassius PARAS, RPH Sent: 08/01/2023   8:00 AM EDT To: Cassius PARAS Brought, RPH Subject: FW: Call patient TNM                            ----- Message ----- From: Brought Cassius PARAS, RPH Sent: 07/11/2023   8:00 AM EDT To: Cassius PARAS Brought, RPH Subject: FW: Call patient TNM                            ----- Message ----- From: Brought Cassius PARAS, [facility] Sent: 07/02/2023  12:00 AM EDT To: Cassius PARAS Brought, RPH Subject: FW: Call patient TNM                            ----- Message ----- From: Brought Cassius PARAS, [facility] Sent: 06/04/2023   8:00 AM EDT To: Cassius PARAS Brought, RPH Subject: FW: Call patient TNM                            ----- Message ----- From: Brought Cassius PARAS, [facility] Sent: 05/25/2023   8:00 AM EDT To: Cassius PARAS Brought, RPH Subject: FW: Call patient TNM                            ----- Message ----- From: Brought Cassius PARAS, [facility] Sent: 05/24/2023  12:00 AM EDT To: Cassius PARAS Brought, RPH Subject: FW: Call patient TNM                            ----- Message ----- From: Brought Cassius PARAS, [facility] Sent: 04/18/2023   8:00 AM EDT To: Cassius PARAS Brought, RPH Subject: FW: Call patient TNM                            ----- Message ----- From: Brought Cassius PARAS, [facility] Sent: 04/13/2023  12:00 AM EDT To: Cassius PARAS Brought, [facility] Subject: FW: Call patient TNM                            ----- Message ----- From: Brought Cassius PARAS, [facility] Sent: 03/30/2023  12:00 AM EST To: Cassius PARAS Brought, [facility] Subject: FW: Call patient TNM                            ----- Message ----- From: Brought Cassius PARAS, [facility] Sent: 03/22/2023  12:00 AM EST To: Cassius PARAS Brought, [facility] Subject: FW: Call patient TNM                           Call attemp #3 ----- Message ----- From: Brought Cassius PARAS, [facility] Sent: 03/14/2023  12:00 AM EST To: Cassius PARAS Brought, [facility] Subject: Call patient TNM                               Second call

## 2023-09-19 ENCOUNTER — Telehealth: Payer: Self-pay

## 2023-09-19 NOTE — Telephone Encounter (Signed)
 Patient was identified as falling into the [place] Measure - Diabetes.   Patient was: Left voicemail to schedule with primary care provider.

## 2023-10-18 ENCOUNTER — Telehealth: Payer: Self-pay | Admitting: Pharmacy Technician

## 2023-10-18 NOTE — Progress Notes (Signed)
   10/18/2023  Patient ID: David Humphrey, male   DOB: Jan 22, 1961, 63 y.o.   MRN: [id number]  Patient engaged with clinical pharmacist for management of diabetes on 06/22/2023. Outreach by [employer] technician was requested.   Outreached patient to discuss diabetes medication management. Left voicemail for patient to return my call at their convenience.   Adylin Hankey, CPhT  [facility] Office: [phone] Email: [email or web address]

## 2023-10-19 ENCOUNTER — Telehealth: Payer: Self-pay | Admitting: Pharmacy Technician

## 2023-10-19 NOTE — Progress Notes (Signed)
   10/19/2023  Patient ID: Fairy JULIANNA [place], male   DOB: January 23, 1961, 63 y.o.   MRN: [id number]  Patient engaged with clinical pharmacist for management of diabetes on 06/22/2023. Outreach by [employer] technician was requested.   Outreached patient to discuss diabetes medication management. Left voicemail for patient to return my call at their convenience.   Yekaterina Escutia, CPhT [place] Office: [phone] Email: [email or web address]

## 2023-10-30 ENCOUNTER — Telehealth: Payer: Self-pay | Admitting: Pharmacy Technician

## 2023-10-30 NOTE — Progress Notes (Signed)
 10/30/2023 Name: David Humphrey MRN: [id number] DOB: [birth year]  Patient is appearing on a report for [employer] [place] Metric Diabetes and last engaged with the clinical pharmacist to discuss diabetes on 06/22/2023. Contacted patient today to discuss diabetes management and completed medication review.   Diabetes Plan from last clinical pharmacist appointment:  Spoke with patient. Patient is currently under [employer] insurance and hasn't received his card yet. Patient was advised to bring card by office when he receives it. Patient advised to get sample since PA is unable to be completed. (copy/paste from last note)   Medication Adherence Barriers Identified:  Patient made recommended medication changes per plan: No Patient is currently NOT taking any medications for his diabetes. He informs he has been out of Ozempic  and Synjardy  for weeks. Was previously getting samples of both medications. He informs office would not provide him with anymore samples until he is seen which is frustrating for him.Called patient's pharmacy [facility] and re ordered Synjardy  and Ozempic . Per Pharmacy staff, Synjardy  is $25 for 90 day supply but Ozempic  needs a PA. Pharmacy staff will send PA request to MD office. Patient was informed medication would be available for pick up later today. Access issues with any new medication or testing device: Yes Ozempic  needs a PA. Pharmacy staff sent request to MD office. Pharmacy staff will refill Synjardy  today and patient can pick up at his convenience.  Patient is checking blood sugars as prescribed: Yes Patient reports his blood sugars have been in the 400s. It is unclear how often he is checking blood sugars.He called for an appointment and was advised the first available appointment was on 1013/ He expressed frustration with the length of time it is taking him to get an appointment due to blood sugars being so high. He reports symptoms of frequent urination every 40 minutes over night since  blood sugars have been high. He informs he thinks the reason they were high is because of the [place]  drinks he had been consuming. He informs he has been drinking water the past 1.5 days and his blood sugars are coming down albeit slowly. Patient also informed during the call that he was out of Atorvastatin , Amlodipine  and per pharmacy staff he should be out of Carvedilol  as well. Pharmacy staff will fill all 3 medications which patient can pick up later today. Patient was also placed on automatic refill with the pharmacy. Per Dr Annemarie, Lisinopril  was last filled for 90 tabs on 09/30/23. Patient informs blood pressure overall is okay. He reports 1 incident in which his blood pressure went up. He reports the highest systolic number was 160 and the highest diastolic number was 110. Patient was not clear if these numbers occurred in the same blood pressure reading. He reports his blood pressure normally is 125/82 range. Patient's A1C in January was 11.4.  Medication Adherence Barriers Addressed/Actions Taken:  Reviewed medication changes per plan from last clinical pharmacist note Medication Access for Ozempic  Will discuss medication access concerns with [facility]ist [facility] staff collaborated with Patient Advocate team regarding prior authorization by sending over the PA request. Contacted pharmacy regarding new prescriptions Educated patient to contact pharmacy regarding new prescriptions/refills. Pharmacy staff placed patient on automatic refills to increased adherence to medication regimen.   Reviewed instructions for monitoring blood sugars at home and reminded patient to keep a written log to review with pharmacist Reminded patient of date/time of upcoming clinical pharmacist follow up and any upcoming PCP/specialists visits. Patient denies transportation barriers to  the appointment. Yes  Next clinical pharmacist appointment is scheduled for: TBD  Kate Caddy, CPhT [facility] Office: [phone] Email: [email or web address]

## 2023-11-05 ENCOUNTER — Ambulatory Visit: Payer: Self-pay | Admitting: Nurse Practitioner

## 2023-11-05 ENCOUNTER — Encounter: Payer: Self-pay | Admitting: Nurse Practitioner

## 2023-11-05 VITALS — BP 110/70 | HR 93 | Temp 98.9°F | Ht 69.0 in | Wt 207.6 lb

## 2023-11-05 DIAGNOSIS — I700 Atherosclerosis of aorta: Secondary | ICD-10-CM

## 2023-11-05 DIAGNOSIS — E1165 Type 2 diabetes mellitus with hyperglycemia: Secondary | ICD-10-CM

## 2023-11-05 DIAGNOSIS — R350 Frequency of micturition: Secondary | ICD-10-CM

## 2023-11-05 DIAGNOSIS — I119 Hypertensive heart disease without heart failure: Secondary | ICD-10-CM | POA: Diagnosis not present

## 2023-11-05 DIAGNOSIS — Z1211 Encounter for screening for malignant neoplasm of colon: Secondary | ICD-10-CM

## 2023-11-05 DIAGNOSIS — Z2821 Immunization not carried out because of patient refusal: Secondary | ICD-10-CM

## 2023-11-05 DIAGNOSIS — L0292 Furuncle, unspecified: Secondary | ICD-10-CM

## 2023-11-05 LAB — POCT URINALYSIS DIP (CLINITEK)
Bilirubin, UA: NEGATIVE
Blood, UA: NEGATIVE
Glucose, UA: 500 mg/dL — AB
Leukocytes, UA: NEGATIVE
Nitrite, UA: NEGATIVE
POC PROTEIN,UA: NEGATIVE
Spec Grav, UA: 1.01 (ref 1.010–1.025)
Urobilinogen, UA: 0.2 U/dL
pH, UA: 5.5 (ref 5.0–8.0)

## 2023-11-05 MED ORDER — CEPHALEXIN 500 MG PO CAPS: 500.0000 mg | ORAL_CAPSULE | Freq: Four times a day (QID) | ORAL | 0 refills | Status: AC

## 2023-11-05 NOTE — Progress Notes (Signed)
 LILLETTE David Humphrey, CMA,acting as a Neurosurgeon for David Ada, FNP.,have documented all relevant documentation on the behalf of David Ada, FNP,as directed by  David Ada, FNP while in the presence of David Ada, FNP.  Subjective:  Patient ID: David Humphrey , male    DOB: 1961-01-15 , 63 y.o.   MRN: [id number]  Chief Complaint  Patient presents with   Hypertension    Patient presents today for a bp and dm follow up, Patient reports compliance with medication. Patient denies any chest pain, SOB, or headaches. Patient reports he has been out of a lot of DM medications due to insurance issues. Patient reports he wasn't able to afford the medication, patient reports he has been having high BS readings. Patient reports he was able to get his synjardy . He reports he is drinking a lot of sugary drinks.   Urinary Frequency    He reports he has been having urinary frequency about every hour he knows it is from his high BS readings.       HPI  Discussed the use of AI scribe software for clinical note transcription with the patient, who gave verbal consent to proceed.  History of Present Illness David Humphrey is a 63 year old male with diabetes who presents with frequent urination and elevated blood sugars.  He experiences frequent urination, particularly nocturia, occurring every hour. This is associated with elevated blood sugar levels, which he noticed after discontinuing his diabetes medications due to financial constraints. He has been off Synjardy  for at least four to six weeks and has not been taking Ozempic  during that time.  Additional symptoms include a change in taste, a burning sensation on his tongue, low energy levels, and feeling groggy. These symptoms differ from when he was first diagnosed with diabetes, despite having high blood sugar levels at that time as well.  He recently resumed taking Synjardy  after obtaining it on Friday, with a blood sugar reading of 551 mg/dL. The  following morning, after fasting, his blood sugar dropped to 251 mg/dL. However, after eating on Saturday, his blood sugar rose to 427 mg/dL on Sunday morning.  Dietary habits contributing to elevated blood sugar include consuming [place]  drinks, soft drinks, and milk. He has abstained from alcohol for two weeks but previously experienced nausea and vomiting after alcohol consumption, which he attributes to high blood sugar levels.  He reports a boil on his back, which is sore and draining. He recalls having a similar boil removed a few years ago. He is not currently working and denies swelling in his feet or ankles.   Past Medical History:  Diagnosis Date   Diabetes mellitus without complication (HCC)    Hypertension      Family History  Problem Relation Age of Onset   Hypertension Mother    Kidney disease Mother    Cancer Father    Cancer Paternal Grandmother      Current Outpatient Medications:    amLODipine  (NORVASC ) 5 MG tablet, Take 1 tablet (5 mg total) by mouth daily., Disp: 90 tablet, Rfl: 2   atorvastatin  (LIPITOR) 10 MG tablet, TAKE 1 TABLET BY MOUTH MONDAY, WEDNESDAY AND FRIDAY, Disp: 108 tablet, Rfl: 1   Blood Glucose Monitoring Suppl (ONETOUCH VERIO) w/Device KIT, Use to test blood sugar three times daily Diagnosis code E11.65, Disp: 1 kit, Rfl: 0   carvedilol  (COREG ) 6.25 MG tablet, TAKE 1 TABLET BY MOUTH 2 TIMES DAILY WITH A MEAL., Disp: 180 tablet, Rfl: 3  cephALEXin  (KEFLEX ) 500 MG capsule, Take 1 capsule (500 mg total) by mouth 4 (four) times daily for 10 days., Disp: 40 capsule, Rfl: 0   Empagliflozin-metFORMIN  HCl ER (SYNJARDY  XR) 25-1000 MG TB24, Take 1 tablet by mouth daily., Disp: 90 tablet, Rfl: 1   Eszopiclone  3 MG TABS, Take 1 tablet (3 mg total) by mouth at bedtime as needed. Take immediately before bedtime (Patient not taking: Reported on 11/09/2023), Disp: 15 tablet, Rfl: 5   glucose blood (ONETOUCH VERIO) test strip, Use to test blood sugar three times  daily Diagnosis code E11.65, Disp: 300 each, Rfl: 3   Lancets (ONETOUCH DELICA PLUS LANCET30G) MISC, Use to test blood sugar three times daily Diagnosis code E11.65, Disp: 300 each, Rfl: 3   lisinopril  (ZESTRIL ) 10 MG tablet, Take 1 tablet (10 mg total) by mouth daily., Disp: 90 tablet, Rfl: 1   sildenafil  (VIAGRA ) 100 MG tablet, Take 1 tablet (100 mg total) by mouth as needed for erectile dysfunction., Disp: 6 tablet, Rfl: 2   Vitamin D , Ergocalciferol , (DRISDOL ) 1.25 MG (50000 UNIT) CAPS capsule, Take 1 capsule (50,000 Units total) by mouth 2 (two) times a week., Disp: 24 capsule, Rfl: 1   Semaglutide ,0.25 or 0.5MG /DOS, 2 MG/3ML SOPN, Inject into skin every Friday as directed!, Disp: 3 mL, Rfl: 2   testosterone  cypionate (DEPOTESTOTERONE CYPIONATE) 100 MG/ML injection, INJECT 1 ML (100 MG TOTAL) INTO THE MUSCLE 2 TIMES A WEEK. (DISCARD VIAL 28 DAYS AFTER PUNCTURES) (Patient not taking: Reported on 11/09/2023), Disp: 10 mL, Rfl: 5   No Known Allergies   Review of Systems  Constitutional: Negative.   Respiratory: Negative.    Cardiovascular: Negative.   Gastrointestinal: Negative.   Endocrine: Negative for polydipsia, polyphagia and polyuria.  Neurological: Negative.   Psychiatric/Behavioral:  Negative for sleep disturbance.      Today's Vitals   11/05/23 1605  BP: 110/70  Pulse: 93  Temp: 98.9 F (37.2 C)  TempSrc: Oral  Weight: 207 lb 9.6 oz (94.2 kg)  Height: 5' 9 (1.753 m)  PainSc: 0-No pain   Body mass index is 30.66 kg/m.  Wt Readings from Last 3 Encounters:  11/05/23 207 lb 9.6 oz (94.2 kg)  03/16/23 219 lb (99.3 kg)  02/26/23 219 lb 12.8 oz (99.7 kg)     Objective:  Physical Exam Vitals and nursing note reviewed.  Constitutional:      General: He is not in acute distress.    Appearance: Normal appearance. He is obese.  Cardiovascular:     Rate and Rhythm: Normal rate and regular rhythm.     Pulses: Normal pulses.     Heart sounds: Normal heart sounds. No  murmur heard. Pulmonary:     Effort: Pulmonary effort is normal. No respiratory distress.     Breath sounds: Normal breath sounds. No wheezing.  Musculoskeletal:        General: No swelling.  Skin:    General: Skin is warm and dry.     Capillary Refill: Capillary refill takes less than 2 seconds.  Neurological:     General: No focal deficit present.     Mental Status: He is alert and oriented to person, place, and time.     Cranial Nerves: No cranial nerve deficit.     Motor: No weakness.  Psychiatric:        Mood and Affect: Mood normal.        Behavior: Behavior normal.        Thought Content: Thought content normal.  Judgment: Judgment normal.      Assessment And Plan:  Type 2 diabetes mellitus with hyperglycemia, without long-term current use of insulin (HCC) Assessment & Plan: Non-adherence to medication and high-sugar intake causing elevated glucose levels due to insurance was out and is now on Cobra. Current treatment with Synjardy  insufficient. Insulin recommended for A1c >10%. - Recheck A1c levels today. - Provide samples of Ozempic  if available. - Consider short-term insulin if hyperglycemia persists. - Advise to avoid high-sugar drinks and alcohol. - Refer to ophthalmology for retinal examination. - I have given samples of Ozempic   Orders: -     Hemoglobin A1c -     Ambulatory referral to Ophthalmology  Hypertensive heart disease without heart failure Assessment & Plan: Blood pressure is well controlled, continue current medications. EKG done NSR HR 78  Orders: -     BMP8+eGFR  Influenza vaccination declined  Encounter for screening colonoscopy -     Ambulatory referral to Gastroenterology  Boil Assessment & Plan: Draining boil on upper back, hard area potentially related to hyperglycemia, increasing infection risk. - Prescribe Keflex  500 mg four times a day for 10 days. - Monitor for improvement; consider referral to general surgery if no  improvement.  Orders: -     Cephalexin ; Take 1 capsule (500 mg total) by mouth 4 (four) times daily for 10 days.  Dispense: 40 capsule; Refill: 0  Urinary frequency -     POCT URINALYSIS DIP (CLINITEK)  Atherosclerosis of aorta    Return for keep same next.  Patient was given opportunity to ask questions. Patient verbalized understanding of the plan and was able to repeat key elements of the plan. All questions were answered to their satisfaction.   LILLETTE David Ada, FNP, have reviewed all documentation for this visit. The documentation on 11/05/23 for the exam, diagnosis, procedures, and orders are all accurate and complete.    IF YOU HAVE BEEN REFERRED TO A SPECIALIST, IT MAY TAKE 1-2 WEEKS TO SCHEDULE/PROCESS THE REFERRAL. IF YOU HAVE NOT HEARD FROM US /SPECIALIST IN TWO WEEKS, PLEASE GIVE US  A CALL AT [phone].

## 2023-11-06 ENCOUNTER — Ambulatory Visit: Payer: Self-pay | Admitting: Nurse Practitioner

## 2023-11-06 LAB — HEMOGLOBIN A1C
Est. average glucose Bld gHb Est-mCnc: 298 mg/dL
Hgb A1c MFr Bld: 12 % — ABNORMAL HIGH (ref 4.8–5.6)

## 2023-11-06 LAB — BMP8+EGFR
BUN/Creatinine Ratio: 16 (ref 10–24)
BUN: 17 mg/dL (ref 8–27)
CO2: 18 mmol/L — ABNORMAL LOW (ref 20–29)
Calcium: 10 mg/dL (ref 8.6–10.2)
Chloride: 93 mmol/L — ABNORMAL LOW (ref 96–106)
Creatinine, Ser: 1.06 mg/dL (ref 0.76–1.27)
Glucose: 251 mg/dL — ABNORMAL HIGH (ref 70–99)
Potassium: 4.7 mmol/L (ref 3.5–5.2)
Sodium: 129 mmol/L — ABNORMAL LOW (ref 134–144)
eGFR: 80 mL/min/1.73 (ref >59)

## 2023-11-09 ENCOUNTER — Telehealth: Payer: Self-pay | Admitting: Pharmacist

## 2023-11-09 DIAGNOSIS — E1165 Type 2 diabetes mellitus with hyperglycemia: Secondary | ICD-10-CM

## 2023-11-09 NOTE — Progress Notes (Unsigned)
 11/09/2023 Name: David Humphrey MRN: [id number] DOB: [birth year]  Chief Complaint  Patient presents with   Medication Management    Diabetes     David Humphrey is a 63 y.o. year old male who presented for a telephone visit.   They were referred to the pharmacist by a quality report for assistance in managing diabetes. [employer] [place] [employer] [facility].  David Humphrey had been lost to Pharmacy follow up due to inability to contact him. He answered my call today. HIPAA identifiers were obtained.   Subjective: Patient is a 63 year old male with multiple medical conditions including but not limited to:  type 2 diabetes, vitamin d  deficiency, atherosclerosis, and hypertension.  Care Team: Primary Care Provider: Georgina Speaks, FNP ; Next Scheduled Visit: 02/27/2024  Medication Access/Adherence  Current Pharmacy:  [id number] [place], [place] - [address] [place] [address] Phone: [phone] Fax: [phone]   Patient reports affordability concerns with their medications: Yes --Getting [employer] Patient reports access/transportation concerns to their pharmacy: No  Patient reports adherence concerns with their medications:  Yes  went six weeks without diabetes mediations   Diabetes:  Current medications:   Synjardy  ER 25/1000 Ozempic  0.5mg  weekly    Patient denies hypoglycemic s/sx including  dizziness, shakiness, sweating. Patient denies hyperglycemic symptoms including  polyuria, polydipsia, polyphagia, nocturia, neuropathy, blurred vision.  Macrovascular and Microvascular Risk Reduction:  Statin? yes (Atorvastatin  10mg  84 day supply filled 10/30/2023); ACEi/ARB? yes (Lisinopril  10 mg #90 filled 09/30/2023) Last urinary albumin/creatinine ratio:  Lab Results  Component Value Date   MICRALBCREAT <7 02/08/2023   MICRALBCREAT 12 02/21/2022   MICRALBCREAT 2 10/20/2021   MICRALBCREAT 30 08/18/2019   MICRALBCREAT 300 01/30/2019    MICRALBCREAT 30-300 04/09/2018   Last eye exam:   Last foot exam: 02/26/2023 Tobacco Use:  Tobacco Use: Low Risk  (11/05/2023)   Patient History    Smoking Tobacco Use: Never    Smokeless Tobacco Use: Never    Passive Exposure: Not on file     Objective:  Lab Results  Component Value Date   HGBA1C 12.0 (H) 11/05/2023    Lab Results  Component Value Date   CREATININE 1.06 11/05/2023   BUN 17 11/05/2023   NA 129 (L) 11/05/2023   K 4.7 11/05/2023   CL 93 (L) 11/05/2023   CO2 18 (L) 11/05/2023    Lab Results  Component Value Date   CHOL 136 02/21/2022   HDL 49 02/21/2022   LDLCALC 71 02/21/2022   TRIG 81 02/21/2022   CHOLHDL 2.8 02/21/2022    Medications Reviewed Today     Reviewed by David Humphrey (Pharmacist) on 11/09/23 at 1600  Med List Status: <None>   Medication Order Taking? Sig Documenting Provider Last Dose Status Informant  amLODipine  (NORVASC ) 5 MG tablet [id number] Yes Take 1 tablet (5 mg total) by mouth daily. David Speaks, FNP  Active   atorvastatin  (LIPITOR) 10 MG tablet [id number] Yes TAKE 1 TABLET BY MOUTH MONDAY, WEDNESDAY AND FRIDAY Humphrey, Janece, FNP  Active   Blood Glucose Monitoring Suppl [facility] VERIO) w/Device KIT [id number] Yes Use to test blood sugar three times daily Diagnosis code E11.65 David Speaks, FNP  Active   carvedilol  (COREG ) 6.25 MG tablet [id number] Yes TAKE 1 TABLET BY MOUTH 2 TIMES DAILY WITH A MEAL. David Speaks, FNP  Active   cephALEXin  (KEFLEX ) 500 MG capsule [id number] Yes Take 1 capsule (500 mg total) by mouth 4 (four) times daily  for 10 days. David Speaks, FNP  Active   Empagliflozin-metFORMIN  HCl ER (SYNJARDY  XR) 25-1000 MG TB24 [id number] Yes Take 1 tablet by mouth daily. David Speaks, FNP  Active   Eszopiclone  3 MG TABS [id number]  Take 1 tablet (3 mg total) by mouth at bedtime as needed. Take immediately before bedtime  Patient not taking: Reported on 11/09/2023   David Speaks, FNP  Active   glucose blood  [facility] VERIO) test strip [id number] Yes Use to test blood sugar three times daily Diagnosis code E11.65 David Speaks, FNP  Active   Lancets [facility] CAT[facility] PL[facility] [place]) MISC [id number] Yes Use to test blood sugar three times daily Diagnosis code E11.65 David Speaks, FNP  Active   lisinopril  (ZESTRIL ) 10 MG tablet [id number] Yes Take 1 tablet (10 mg total) by mouth daily. David Speaks, FNP  Active   Semaglutide ,0.25 or 0.5MG /DOS, 2 MG/3ML NELMA [id number] Yes Inject into skin every Friday as directed! David Speaks, FNP  Active   sildenafil  (VIAGRA ) 100 MG tablet [id number] Yes Take 1 tablet (100 mg total) by mouth as needed for erectile dysfunction. David Speaks, FNP  Active   testosterone  cypionate (DEPOTESTOTERONE CYPIONATE) 100 MG/ML injection [id number]  INJECT 1 ML (100 MG TOTAL) INTO THE MUSCLE 2 TIMES A WEEK. (DISCARD VIAL 28 DAYS AFTER PUNCTURES)  Patient not taking: Reported on 11/09/2023   David Speaks, FNP  Active   Vitamin D , Ergocalciferol , (DRISDOL ) 1.25 MG (50000 UNIT) CAPS capsule [id number]  Take 1 capsule (50,000 Units total) by mouth 2 (two) times a week. David Speaks, FNP  Active                 11/05/2023    4:05 PM 03/16/2023   10:06 AM 02/26/2023    2:08 PM  Vitals with BMI  Height 5' 9 5' 9 5' 9  Weight 207 lbs 10 oz 219 lbs 219 lbs 13 oz  BMI 30.64 32.33 32.44  Systolic 110 122 879  Diastolic 70 80 80  Pulse 93 85 84     Assessment/Plan:   Diabetes: - Currently uncontrolled; goal A1c <7%. Cardiorenal risk reduction is optimized.. Blood pressure is at goal <130/80. LDL is at goal.  - Sent coupons for Synjardy  and Ozempic . Reviewed blood sugar goals. - Recommend to continue current therapy. Sent coupons for both Ozempic  and Synjardy  ER. - Ozempic  dose could be optimized   Follow Up Plan:    Call in 1 month.  Increase Ozempic  dose.   David Humphrey, PharmD, BCACP Clinical Pharmacist [phone]

## 2023-11-11 DIAGNOSIS — L0292 Furuncle, unspecified: Secondary | ICD-10-CM | POA: Insufficient documentation

## 2023-11-11 DIAGNOSIS — R350 Frequency of micturition: Secondary | ICD-10-CM | POA: Insufficient documentation

## 2023-11-11 NOTE — Assessment & Plan Note (Signed)
 Blood pressure is well controlled, continue current medications. EKG done NSR HR 78

## 2023-11-11 NOTE — Assessment & Plan Note (Addendum)
 Non-adherence to medication and high-sugar intake causing elevated glucose levels due to insurance was out and is now on Cobra. Current treatment with Synjardy  insufficient. Insulin recommended for A1c >10%. - Recheck A1c levels today. - Provide samples of Ozempic  if available. - Consider short-term insulin if hyperglycemia persists. - Advise to avoid high-sugar drinks and alcohol. - Refer to ophthalmology for retinal examination. - I have given samples of Ozempic 

## 2023-11-11 NOTE — Assessment & Plan Note (Signed)
 Draining boil on upper back, hard area potentially related to hyperglycemia, increasing infection risk. - Prescribe Keflex  500 mg four times a day for 10 days. - Monitor for improvement; consider referral to general surgery if no improvement.

## 2023-11-27 ENCOUNTER — Other Ambulatory Visit: Payer: Self-pay | Admitting: Nurse Practitioner

## 2023-11-27 ENCOUNTER — Telehealth: Payer: Self-pay | Admitting: Pharmacist

## 2023-11-27 DIAGNOSIS — Z794 Long term (current) use of insulin: Secondary | ICD-10-CM

## 2023-11-27 DIAGNOSIS — E1165 Type 2 diabetes mellitus with hyperglycemia: Secondary | ICD-10-CM

## 2023-11-27 DIAGNOSIS — F5101 Primary insomnia: Secondary | ICD-10-CM

## 2023-11-27 MED ORDER — SEMAGLUTIDE (1 MG/DOSE) 4 MG/3ML ~~LOC~~ SOPN: 1.0000 mg | PEN_INJECTOR | SUBCUTANEOUS | 1 refills | Status: AC

## 2023-11-27 NOTE — Progress Notes (Unsigned)
   11/27/2023  Patient ID: David Humphrey, male   DOB: [birth year], 63 y.o.   MRN: [id number]  Called Patient to follow up on medication assistance.

## 2023-11-28 ENCOUNTER — Encounter: Payer: Self-pay | Admitting: Pharmacist

## 2023-12-28 ENCOUNTER — Other Ambulatory Visit: Payer: Self-pay | Admitting: Nurse Practitioner

## 2023-12-28 DIAGNOSIS — I119 Hypertensive heart disease without heart failure: Secondary | ICD-10-CM

## 2023-12-31 ENCOUNTER — Telehealth: Payer: Self-pay | Admitting: Pharmacist

## 2023-12-31 DIAGNOSIS — E1165 Type 2 diabetes mellitus with hyperglycemia: Secondary | ICD-10-CM

## 2023-12-31 NOTE — Progress Notes (Signed)
   12/31/2023  Patient ID: David Humphrey, male   DOB: 09/26/1960, 63 y.o.   MRN: [id number]  Patient was called regarding diabetes management. Unfortunately, he did not answer his phone.   HIPAA compliant message was left on his phone.  Lab Results  Component Value Date   HGBA1C 12.0 (H) 11/05/2023   HGBA1C 11.4 (H) 02/08/2023   HGBA1C 8.7 (H) 02/21/2022    Next Appt: 02/27/24  Patient had been non-compliant with his medications due to cost. Most chronic meds were filled 10/30/23 for a 90 day supply  He was sent coupons to help with Ozempic  and Synjardy . XR    On statin therapy _ Atorvastatin  10 mg last filled 10/30/23--supd [facility] closed   Plan: will call patient back in 1-2 weeks.   David Humphrey, PharmD, BCACP Clinical Pharmacist [phone]

## 2024-01-21 ENCOUNTER — Other Ambulatory Visit: Payer: Self-pay | Admitting: Nurse Practitioner

## 2024-01-21 DIAGNOSIS — E1165 Type 2 diabetes mellitus with hyperglycemia: Secondary | ICD-10-CM

## 2024-01-21 DIAGNOSIS — N529 Male erectile dysfunction, unspecified: Secondary | ICD-10-CM

## 2024-02-05 ENCOUNTER — Telehealth: Payer: Self-pay | Admitting: Pharmacist

## 2024-02-05 ENCOUNTER — Telehealth: Payer: Self-pay

## 2024-02-05 ENCOUNTER — Ambulatory Visit: Payer: Self-pay

## 2024-02-05 DIAGNOSIS — E1165 Type 2 diabetes mellitus with hyperglycemia: Secondary | ICD-10-CM

## 2024-02-05 NOTE — Telephone Encounter (Signed)
 PAP: Patient assistance application for Synjardy  through [employer]) has been mailed to pt's home address on file. Provider portion of application will be faxed to provider's office.

## 2024-02-05 NOTE — Progress Notes (Signed)
" ° °  02/05/2024 Name: David Humphrey MRN: [id number] DOB: 28-Jun-1960  Chief Complaint  Patient presents with   Medication Assistance    Synjardy  XR   Patient called and said there was an issue with his Synjardy  XR prescription.  He said the Pharmacy told him they did not receive the prescription sent by [name], NP on 01/21/24.  HIPAA identifiers were obtained.  [facility] [place] [facility]  The Pharmacist said Synjardy  XR was/is ready for the Patient and had a copay of $1600 for a 90 day supply.  Patient confirmed that he is unemployed and does not have active insurance.    [name], NP approved some samples to be given to the Patient of both Synjardy  XR and Ozempic    Patient will come into the office tomorrow to sign patient assistance paperwork and pick up the samples.   [name], PharmD, BCACP Clinical Pharmacist [phone]    "

## 2024-02-05 NOTE — Telephone Encounter (Signed)
 PAP: Patient assistance application for Ozempic through [employer] has been mailed to pt's home address on file. Provider portion of application will be faxed to provider's office.

## 2024-02-06 ENCOUNTER — Ambulatory Visit: Payer: Self-pay | Admitting: Pharmacist

## 2024-02-06 DIAGNOSIS — I119 Hypertensive heart disease without heart failure: Secondary | ICD-10-CM

## 2024-02-06 MED ORDER — LISINOPRIL 10 MG PO TABS: 10.0000 mg | ORAL_TABLET | Freq: Every day | ORAL | 6 refills | Status: AC

## 2024-02-06 NOTE — Telephone Encounter (Signed)
 Received Patient portion PAP application Synjardy  XR ([place])

## 2024-02-06 NOTE — Progress Notes (Signed)
 "  02/06/2024 Name: David Humphrey MRN: [id number] DOB: Nov 11, 1960  Chief Complaint  Patient presents with   Medication Assistance    Synjardy  XR & Ozempic      David Humphrey is a 64 y.o. year old male who was referred for medication management by their primary care provider, David Speaks, FNP. They presented for a face to face visit today.   They were referred to the pharmacist by their PCP for assistance in managing medication access to diabetes medications Synjardy  XR and Ozempic .   Subjective: David Humphrey is a 64 year old male with multiple medical conditions including but not limited to:  aortic atherosclerosis, hypertension, type 2 diabetes, vitamin D  deficiency.and hyperlipidemia. 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 He contacted the office inquiring about Synjardy  and Ozempic .    Care Team: Primary Care Provider: Georgina Speaks, FNP ; Next Scheduled Visit: 02/27/2024   Medication Access/Adherence  [facility] Pharmacy[facility]  [id number] [address] [place] - [address] [name] [address] Phone: [phone] Fax: [phone]  [id number] - [place] - [address]  ([place]) [place] [address] Phone: [phone] Fax: [phone]   Patient reports affordability concerns with their medications: Yes --currently uninsured Patient reports access/transportation concerns to their pharmacy: No  Patient reports adherence concerns with their medications:  Yes  Due to cost   Diabetes:  Current medications:   Ozempic  1 mg weekly Synjardy  XR 25-1000  Patient denies hypoglycemic s/sx including  dizziness, shakiness, sweating. Patient denies hyperglycemic symptoms including  polyuria, polydipsia, polyphagia, nocturia, neuropathy, blurred vision.  Macrovascular and Microvascular Risk Reduction:  Statin? yes (Atorvastatin  10 mg -needs refill ); ACEi/ARB? yes (Lisinopril  10  mg) Last urinary albumin/creatinine ratio:  Lab Results  Component Value Date   MICRALBCREAT <7 02/08/2023   MICRALBCREAT 12 02/21/2022   MICRALBCREAT 2 10/20/2021   MICRALBCREAT 30 08/18/2019   MICRALBCREAT 300 01/30/2019   MICRALBCREAT 30-300 04/09/2018   Last eye exam:   Last foot exam: 02/26/2023 Tobacco Use:  Tobacco Use: Low Risk (11/05/2023)   Patient History    Smoking Tobacco Use: Never    Smokeless Tobacco Use: Never    Passive Exposure: Not on file     Objective:  Lab Results  Component Value Date   HGBA1C 12.0 (H) 11/05/2023    Lab Results  Component Value Date   CREATININE 1.06 11/05/2023   BUN 17 11/05/2023   NA 129 (L) 11/05/2023   K 4.7 11/05/2023   CL 93 (L) 11/05/2023   CO2 18 (L) 11/05/2023    Lab Results  Component Value Date   CHOL 136 02/21/2022   HDL 49 02/21/2022   LDLCALC 71 02/21/2022   TRIG 81 02/21/2022   CHOLHDL 2.8 02/21/2022      11/05/2023    4:05 PM 03/16/2023   10:06 AM 02/26/2023    2:08 PM  Vitals with BMI  Height 5' 9 5' 9 5' 9  Weight 207 lbs 10 oz 219 lbs 219 lbs 13 oz  BMI 30.64 32.33 32.44  Systolic 110 122 879  Diastolic 70 80 80  Pulse 93 85 84       Assessment/Plan:   Diabetes: - Currently uncontrolled; goal A1c <7%. Cardiorenal risk reduction is opportunities for improvement.. Blood pressure is at goal <130/80. LDL is at goal.  - Completed Patient Assistance applications for Synjardy  XR and Ozempic . Patient signatures obtained.  Patient will provide financial information  at a later date.   David Ada, NP provided Samples of Synjardy  (Left up front earlier by clinic staff and Ozepmic--lot numbers below):  (3)Lot [id number] 04/23/2026  (1)LOT  [id number] 03/23/2026   Follow Up Plan:   Call Patient back about refills of Lisinopril  and Atorvastatin  and about his financial paperwork.  [name], PharmD, BCACP Clinical Pharmacist [phone]       "

## 2024-02-06 NOTE — Telephone Encounter (Signed)
 Received Patient  portion PAP application for Ozempic  (Novo Nordisk)

## 2024-02-19 ENCOUNTER — Telehealth: Payer: Self-pay | Admitting: Pharmacist

## 2024-02-19 DIAGNOSIS — E1165 Type 2 diabetes mellitus with hyperglycemia: Secondary | ICD-10-CM

## 2024-02-19 NOTE — Progress Notes (Signed)
" ° °  02/19/2024 Name: David Humphrey MRN: [id number] DOB: [birth year]  Chief Complaint  Patient presents with   Medication Assistance    Synjardy  XR and Ozempic     David Humphrey is a 64 y.o. year old male who presented for a telephone visit.   They were referred to the pharmacist by their PCP for assistance in managing diabetes and medication access.    Subjective:  Care Team: Primary Care Provider: Georgina Speaks, FNP ; Next Scheduled Visit: 02/27/2024   Patient sent his financial documentation to me. Purpose of today's call was to let him know the documents were received and they were faxed to Luke Mall, CPhT for processing and scanning.  Patient confirmed he still had samples of both Synjardy  XR and Ozempic .  Plan; Follow up around the Patient's PCP appt 0204/26.   David Humphrey, PharmD, BCACP Clinical Pharmacist [phone]  "

## 2024-02-25 NOTE — Assessment & Plan Note (Signed)
 [name]

## 2024-02-25 NOTE — Assessment & Plan Note (Signed)
 David Humphrey

## 2024-02-27 ENCOUNTER — Encounter: Payer: Self-pay | Admitting: Pharmacist

## 2024-02-27 ENCOUNTER — Encounter: Payer: Self-pay | Admitting: Nurse Practitioner

## 2024-02-27 DIAGNOSIS — E559 Vitamin D deficiency, unspecified: Secondary | ICD-10-CM

## 2024-02-27 DIAGNOSIS — I119 Hypertensive heart disease without heart failure: Secondary | ICD-10-CM

## 2024-02-27 DIAGNOSIS — Z0000 Encounter for general adult medical examination without abnormal findings: Secondary | ICD-10-CM

## 2024-02-27 DIAGNOSIS — E1165 Type 2 diabetes mellitus with hyperglycemia: Secondary | ICD-10-CM

## 2024-02-27 DIAGNOSIS — F5101 Primary insomnia: Secondary | ICD-10-CM

## 2024-02-27 NOTE — Progress Notes (Signed)
" ° °  02/27/2024 Name: ABB GOBERT MRN: [id number] DOB: 08/06/1960  Chief Complaint  Patient presents with   Medication Assistance    The provider portion of the Patient's applications were completed and the Provider's signature was obtained. The applications and the Patient's financial documentation were faxed to Luke Mall, CPhT.  Synjardy  XR Ozempic  1 mg   [name], PharmD, BCACP Clinical Pharmacist [phone]  "

## 2024-06-11 ENCOUNTER — Encounter: Payer: Self-pay | Admitting: Nurse Practitioner
